# Patient Record
Sex: Male | Born: 1970
Health system: Southern US, Community
[De-identification: ages and names within clinical notes are randomized; demographics above are authoritative.]

## PROBLEM LIST (undated history)

## (undated) ENCOUNTER — Emergency Department (HOSPITAL_BASED_OUTPATIENT_CLINIC_OR_DEPARTMENT_OTHER)
Admission: EM | Disposition: A | Discharge status: Home / Self Care | Payer: BC Managed Care – PPO | Source: Home / Self Care

## (undated) DIAGNOSIS — N189 Chronic kidney disease, unspecified: Secondary | ICD-10-CM

## (undated) DIAGNOSIS — H8110 Benign paroxysmal vertigo, unspecified ear: Secondary | ICD-10-CM

## (undated) DIAGNOSIS — I10 Essential (primary) hypertension: Secondary | ICD-10-CM

## (undated) DIAGNOSIS — K432 Incisional hernia without obstruction or gangrene: Secondary | ICD-10-CM

## (undated) HISTORY — DX: Essential (primary) hypertension: I10

---

## 1996-07-06 HISTORY — PX: CYSTOSCOPY: SUR368

## 1996-07-06 HISTORY — PX: KIDNEY SURGERY: SHX687

## 2000-04-01 ENCOUNTER — Ambulatory Visit (HOSPITAL_COMMUNITY): Admission: RE | Admit: 2000-04-01 | Discharge: 2000-04-01 | Payer: Self-pay | Admitting: Urology

## 2000-04-01 ENCOUNTER — Encounter: Payer: Self-pay | Admitting: Urology

## 2001-05-25 ENCOUNTER — Ambulatory Visit (HOSPITAL_BASED_OUTPATIENT_CLINIC_OR_DEPARTMENT_OTHER): Admission: RE | Admit: 2001-05-25 | Discharge: 2001-05-25 | Payer: Self-pay | Admitting: General Surgery

## 2001-10-05 ENCOUNTER — Emergency Department (HOSPITAL_COMMUNITY): Admission: EM | Admit: 2001-10-05 | Discharge: 2001-10-05 | Payer: Self-pay | Admitting: Emergency Medicine

## 2005-07-30 ENCOUNTER — Ambulatory Visit (HOSPITAL_COMMUNITY): Admission: RE | Admit: 2005-07-30 | Discharge: 2005-07-30 | Payer: Self-pay | Admitting: Urology

## 2016-04-09 DIAGNOSIS — Z23 Encounter for immunization: Secondary | ICD-10-CM | POA: Diagnosis not present

## 2016-11-11 DIAGNOSIS — R109 Unspecified abdominal pain: Secondary | ICD-10-CM | POA: Diagnosis not present

## 2017-02-13 DIAGNOSIS — K644 Residual hemorrhoidal skin tags: Secondary | ICD-10-CM | POA: Diagnosis not present

## 2017-02-13 DIAGNOSIS — A084 Viral intestinal infection, unspecified: Secondary | ICD-10-CM | POA: Diagnosis not present

## 2017-02-15 DIAGNOSIS — A09 Infectious gastroenteritis and colitis, unspecified: Secondary | ICD-10-CM | POA: Diagnosis not present

## 2017-02-16 DIAGNOSIS — A09 Infectious gastroenteritis and colitis, unspecified: Secondary | ICD-10-CM | POA: Diagnosis not present

## 2017-04-08 DIAGNOSIS — Z23 Encounter for immunization: Secondary | ICD-10-CM | POA: Diagnosis not present

## 2017-06-05 HISTORY — PX: APPENDECTOMY: SHX54

## 2017-07-04 ENCOUNTER — Encounter (HOSPITAL_COMMUNITY): Payer: Self-pay | Admitting: Emergency Medicine

## 2017-07-04 ENCOUNTER — Emergency Department (HOSPITAL_COMMUNITY): Payer: BLUE CROSS/BLUE SHIELD | Admitting: Anesthesiology

## 2017-07-04 ENCOUNTER — Encounter (HOSPITAL_COMMUNITY): Admission: EM | Disposition: A | Discharge status: Home / Self Care | Payer: Self-pay | Source: Home / Self Care

## 2017-07-04 ENCOUNTER — Emergency Department (HOSPITAL_COMMUNITY): Payer: BLUE CROSS/BLUE SHIELD

## 2017-07-04 ENCOUNTER — Inpatient Hospital Stay (HOSPITAL_COMMUNITY)
Admission: EM | Admit: 2017-07-04 | Discharge: 2017-07-07 | DRG: 339 | Disposition: A | Discharge status: Home / Self Care | Payer: BLUE CROSS/BLUE SHIELD | Attending: General Surgery | Admitting: General Surgery

## 2017-07-04 ENCOUNTER — Other Ambulatory Visit: Payer: Self-pay

## 2017-07-04 DIAGNOSIS — R109 Unspecified abdominal pain: Secondary | ICD-10-CM | POA: Diagnosis not present

## 2017-07-04 DIAGNOSIS — K3580 Unspecified acute appendicitis: Secondary | ICD-10-CM | POA: Diagnosis present

## 2017-07-04 DIAGNOSIS — N179 Acute kidney failure, unspecified: Secondary | ICD-10-CM | POA: Diagnosis not present

## 2017-07-04 DIAGNOSIS — R7989 Other specified abnormal findings of blood chemistry: Secondary | ICD-10-CM

## 2017-07-04 DIAGNOSIS — K3532 Acute appendicitis with perforation and localized peritonitis, without abscess: Secondary | ICD-10-CM | POA: Diagnosis not present

## 2017-07-04 DIAGNOSIS — R1031 Right lower quadrant pain: Secondary | ICD-10-CM | POA: Diagnosis not present

## 2017-07-04 HISTORY — PX: LAPAROSCOPIC APPENDECTOMY: SHX408

## 2017-07-04 LAB — URINALYSIS, ROUTINE W REFLEX MICROSCOPIC
Bilirubin Urine: NEGATIVE
GLUCOSE, UA: NEGATIVE mg/dL
HGB URINE DIPSTICK: NEGATIVE
KETONES UR: NEGATIVE mg/dL
LEUKOCYTES UA: NEGATIVE
Nitrite: NEGATIVE
PROTEIN: NEGATIVE mg/dL
Specific Gravity, Urine: 1.016 (ref 1.005–1.030)
pH: 5 (ref 5.0–8.0)

## 2017-07-04 LAB — COMPREHENSIVE METABOLIC PANEL
ALK PHOS: 60 U/L (ref 38–126)
ALT: 13 U/L — AB (ref 17–63)
AST: 25 U/L (ref 15–41)
Albumin: 4 g/dL (ref 3.5–5.0)
Anion gap: 9 (ref 5–15)
BILIRUBIN TOTAL: 0.7 mg/dL (ref 0.3–1.2)
BUN: 20 mg/dL (ref 6–20)
CALCIUM: 9.2 mg/dL (ref 8.9–10.3)
CO2: 27 mmol/L (ref 22–32)
CREATININE: 1.34 mg/dL — AB (ref 0.61–1.24)
Chloride: 101 mmol/L (ref 101–111)
Glucose, Bld: 120 mg/dL — ABNORMAL HIGH (ref 65–99)
Potassium: 4 mmol/L (ref 3.5–5.1)
Sodium: 137 mmol/L (ref 135–145)
Total Protein: 7.2 g/dL (ref 6.5–8.1)

## 2017-07-04 LAB — CBC
HCT: 42.3 % (ref 39.0–52.0)
Hemoglobin: 14.2 g/dL (ref 13.0–17.0)
MCH: 30.3 pg (ref 26.0–34.0)
MCHC: 33.6 g/dL (ref 30.0–36.0)
MCV: 90.4 fL (ref 78.0–100.0)
PLATELETS: 243 10*3/uL (ref 150–400)
RBC: 4.68 MIL/uL (ref 4.22–5.81)
RDW: 13.5 % (ref 11.5–15.5)
WBC: 7.8 10*3/uL (ref 4.0–10.5)

## 2017-07-04 LAB — I-STAT CG4 LACTIC ACID, ED: LACTIC ACID, VENOUS: 0.92 mmol/L (ref 0.5–1.9)

## 2017-07-04 LAB — LIPASE, BLOOD: Lipase: 38 U/L (ref 11–51)

## 2017-07-04 SURGERY — APPENDECTOMY, LAPAROSCOPIC
Anesthesia: General | Site: Abdomen

## 2017-07-04 MED ORDER — SODIUM CHLORIDE 0.9 % IV BOLUS (SEPSIS)
1000.0000 mL | Freq: Once | INTRAVENOUS | Status: AC
  Administered 2017-07-04: 1000 mL via INTRAVENOUS

## 2017-07-04 MED ORDER — HYDROMORPHONE HCL 1 MG/ML IJ SOLN
INTRAMUSCULAR | Status: AC
  Administered 2017-07-04: 0.5 mg via INTRAVENOUS
  Filled 2017-07-04: qty 1

## 2017-07-04 MED ORDER — ENOXAPARIN SODIUM 40 MG/0.4ML ~~LOC~~ SOLN
40.0000 mg | SUBCUTANEOUS | Status: DC
  Administered 2017-07-05 – 2017-07-07 (×3): 40 mg via SUBCUTANEOUS
  Filled 2017-07-04 (×3): qty 0.4

## 2017-07-04 MED ORDER — ONDANSETRON HCL 4 MG/2ML IJ SOLN
INTRAMUSCULAR | Status: AC
  Filled 2017-07-04: qty 4

## 2017-07-04 MED ORDER — ROCURONIUM BROMIDE 10 MG/ML (PF) SYRINGE
PREFILLED_SYRINGE | INTRAVENOUS | Status: AC
  Filled 2017-07-04: qty 20

## 2017-07-04 MED ORDER — SUCCINYLCHOLINE 20MG/ML (10ML) SYRINGE FOR MEDFUSION PUMP - OPTIME
INTRAMUSCULAR | Status: DC | PRN
  Administered 2017-07-04: 100 mg via INTRAVENOUS

## 2017-07-04 MED ORDER — PIPERACILLIN-TAZOBACTAM 3.375 G IVPB 30 MIN
3.3750 g | Freq: Once | INTRAVENOUS | Status: AC
  Administered 2017-07-04: 3.375 g via INTRAVENOUS
  Filled 2017-07-04: qty 50

## 2017-07-04 MED ORDER — SODIUM CHLORIDE 0.9 % IV BOLUS (SEPSIS)
500.0000 mL | Freq: Once | INTRAVENOUS | Status: AC
  Administered 2017-07-04: 500 mL via INTRAVENOUS

## 2017-07-04 MED ORDER — PROPOFOL 10 MG/ML IV BOLUS
INTRAVENOUS | Status: DC | PRN
  Administered 2017-07-04: 150 mg via INTRAVENOUS

## 2017-07-04 MED ORDER — SODIUM CHLORIDE 0.9 % IR SOLN
Status: DC | PRN
  Administered 2017-07-04: 1000 mL

## 2017-07-04 MED ORDER — HYDROMORPHONE HCL 1 MG/ML IJ SOLN
0.2500 mg | INTRAMUSCULAR | Status: DC | PRN
  Administered 2017-07-04 (×4): 0.5 mg via INTRAVENOUS

## 2017-07-04 MED ORDER — ONDANSETRON 4 MG PO TBDP: 4.0000 mg | ORAL_TABLET | Freq: Four times a day (QID) | ORAL | Status: DC | PRN

## 2017-07-04 MED ORDER — PROMETHAZINE HCL 25 MG/ML IJ SOLN: 6.2500 mg | INTRAMUSCULAR | Status: DC | PRN

## 2017-07-04 MED ORDER — BUPIVACAINE-EPINEPHRINE 0.25% -1:200000 IJ SOLN
INTRAMUSCULAR | Status: AC
  Filled 2017-07-04: qty 1

## 2017-07-04 MED ORDER — SODIUM CHLORIDE 0.9 % IV SOLN
INTRAVENOUS | Status: DC
  Administered 2017-07-04: 75 mL/h via INTRAVENOUS

## 2017-07-04 MED ORDER — SODIUM CHLORIDE 0.9 % IV SOLN
INTRAVENOUS | Status: DC
  Administered 2017-07-05: 01:00:00 via INTRAVENOUS

## 2017-07-04 MED ORDER — MORPHINE SULFATE (PF) 4 MG/ML IV SOLN
2.0000 mg | INTRAVENOUS | Status: DC | PRN
  Administered 2017-07-04 (×2): 4 mg via INTRAVENOUS
  Filled 2017-07-04 (×2): qty 1

## 2017-07-04 MED ORDER — MIDAZOLAM HCL 2 MG/2ML IJ SOLN
INTRAMUSCULAR | Status: AC
  Filled 2017-07-04: qty 2

## 2017-07-04 MED ORDER — ACETAMINOPHEN 500 MG PO TABS
1000.0000 mg | ORAL_TABLET | Freq: Four times a day (QID) | ORAL | Status: DC
  Administered 2017-07-05 (×2): 1000 mg via ORAL
  Filled 2017-07-04 (×3): qty 2

## 2017-07-04 MED ORDER — SUGAMMADEX SODIUM 200 MG/2ML IV SOLN
INTRAVENOUS | Status: AC
  Filled 2017-07-04: qty 2

## 2017-07-04 MED ORDER — PIPERACILLIN-TAZOBACTAM 3.375 G IVPB
3.3750 g | Freq: Once | INTRAVENOUS | Status: AC
  Administered 2017-07-04: 3.375 g via INTRAVENOUS
  Filled 2017-07-04: qty 50

## 2017-07-04 MED ORDER — 0.9 % SODIUM CHLORIDE (POUR BTL) OPTIME
TOPICAL | Status: DC | PRN
  Administered 2017-07-04: 1000 mL

## 2017-07-04 MED ORDER — FENTANYL CITRATE (PF) 250 MCG/5ML IJ SOLN
INTRAMUSCULAR | Status: DC | PRN
  Administered 2017-07-04 (×3): 50 ug via INTRAVENOUS

## 2017-07-04 MED ORDER — SENNOSIDES-DOCUSATE SODIUM 8.6-50 MG PO TABS: 1.0000 | ORAL_TABLET | Freq: Every evening | ORAL | Status: DC | PRN

## 2017-07-04 MED ORDER — SUCCINYLCHOLINE CHLORIDE 200 MG/10ML IV SOSY
PREFILLED_SYRINGE | INTRAVENOUS | Status: AC
  Filled 2017-07-04: qty 10

## 2017-07-04 MED ORDER — PIPERACILLIN-TAZOBACTAM 3.375 G IVPB
3.3750 g | Freq: Three times a day (TID) | INTRAVENOUS | Status: DC
  Administered 2017-07-05 – 2017-07-07 (×8): 3.375 g via INTRAVENOUS
  Filled 2017-07-04 (×10): qty 50

## 2017-07-04 MED ORDER — DIPHENHYDRAMINE HCL 25 MG PO CAPS: 25.0000 mg | ORAL_CAPSULE | Freq: Four times a day (QID) | ORAL | Status: DC | PRN

## 2017-07-04 MED ORDER — LACTATED RINGERS IV SOLN
INTRAVENOUS | Status: DC | PRN
  Administered 2017-07-04 (×2): via INTRAVENOUS

## 2017-07-04 MED ORDER — ONDANSETRON HCL 4 MG/2ML IJ SOLN
4.0000 mg | Freq: Four times a day (QID) | INTRAMUSCULAR | Status: DC | PRN
  Administered 2017-07-05: 4 mg via INTRAVENOUS
  Filled 2017-07-04: qty 2

## 2017-07-04 MED ORDER — EPHEDRINE SULFATE 50 MG/ML IJ SOLN
INTRAMUSCULAR | Status: DC | PRN
  Administered 2017-07-04: 10 mg via INTRAVENOUS

## 2017-07-04 MED ORDER — DEXAMETHASONE SODIUM PHOSPHATE 10 MG/ML IJ SOLN
INTRAMUSCULAR | Status: AC
  Filled 2017-07-04: qty 2

## 2017-07-04 MED ORDER — KETOROLAC TROMETHAMINE 30 MG/ML IJ SOLN
INTRAMUSCULAR | Status: AC
  Filled 2017-07-04: qty 1

## 2017-07-04 MED ORDER — DIPHENHYDRAMINE HCL 50 MG/ML IJ SOLN: 25.0000 mg | Freq: Four times a day (QID) | INTRAMUSCULAR | Status: DC | PRN

## 2017-07-04 MED ORDER — PROPOFOL 10 MG/ML IV BOLUS
INTRAVENOUS | Status: AC
  Filled 2017-07-04: qty 20

## 2017-07-04 MED ORDER — METRONIDAZOLE IN NACL 5-0.79 MG/ML-% IV SOLN: 500.0000 mg | Freq: Once | INTRAVENOUS | Status: DC

## 2017-07-04 MED ORDER — ROCURONIUM 10MG/ML (10ML) SYRINGE FOR MEDFUSION PUMP - OPTIME
INTRAVENOUS | Status: DC | PRN
  Administered 2017-07-04: 10 mg via INTRAVENOUS
  Administered 2017-07-04: 40 mg via INTRAVENOUS
  Administered 2017-07-04 (×2): 20 mg via INTRAVENOUS

## 2017-07-04 MED ORDER — SUCCINYLCHOLINE CHLORIDE 200 MG/10ML IV SOSY
PREFILLED_SYRINGE | INTRAVENOUS | Status: AC
Start: 2017-07-04 — End: 2017-07-04
  Filled 2017-07-04: qty 10

## 2017-07-04 MED ORDER — HYDROCODONE-ACETAMINOPHEN 5-325 MG PO TABS
1.0000 | ORAL_TABLET | ORAL | Status: DC | PRN
  Administered 2017-07-05: 2 via ORAL
  Administered 2017-07-05: 1 via ORAL
  Administered 2017-07-05: 2 via ORAL
  Administered 2017-07-05: 1 via ORAL
  Administered 2017-07-06 – 2017-07-07 (×7): 2 via ORAL
  Filled 2017-07-04 (×9): qty 2
  Filled 2017-07-04: qty 1
  Filled 2017-07-04: qty 2

## 2017-07-04 MED ORDER — IOPAMIDOL (ISOVUE-300) INJECTION 61%
INTRAVENOUS | Status: AC
  Administered 2017-07-04: 100 mL
  Filled 2017-07-04: qty 100

## 2017-07-04 MED ORDER — MORPHINE SULFATE (PF) 2 MG/ML IV SOLN
2.0000 mg | INTRAVENOUS | Status: DC | PRN
  Administered 2017-07-05: 2 mg via INTRAVENOUS
  Filled 2017-07-04: qty 1

## 2017-07-04 MED ORDER — ONDANSETRON HCL 4 MG/2ML IJ SOLN
INTRAMUSCULAR | Status: DC | PRN
  Administered 2017-07-04: 4 mg via INTRAVENOUS

## 2017-07-04 MED ORDER — HYDROMORPHONE HCL 1 MG/ML IJ SOLN
0.5000 mg | Freq: Once | INTRAMUSCULAR | Status: AC
  Administered 2017-07-04: 0.5 mg via INTRAVENOUS
  Filled 2017-07-04: qty 1

## 2017-07-04 MED ORDER — MIDAZOLAM HCL 2 MG/2ML IJ SOLN
INTRAMUSCULAR | Status: DC | PRN
  Administered 2017-07-04: 2 mg via INTRAVENOUS

## 2017-07-04 MED ORDER — SUGAMMADEX SODIUM 200 MG/2ML IV SOLN
INTRAVENOUS | Status: AC
  Filled 2017-07-04: qty 4

## 2017-07-04 MED ORDER — KETOROLAC TROMETHAMINE 30 MG/ML IJ SOLN
30.0000 mg | Freq: Once | INTRAMUSCULAR | Status: DC | PRN
  Administered 2017-07-04: 30 mg via INTRAVENOUS

## 2017-07-04 MED ORDER — FENTANYL CITRATE (PF) 250 MCG/5ML IJ SOLN
INTRAMUSCULAR | Status: AC
  Filled 2017-07-04: qty 5

## 2017-07-04 MED ORDER — SODIUM CHLORIDE 0.9 % IJ SOLN
INTRAMUSCULAR | Status: AC
  Filled 2017-07-04: qty 20

## 2017-07-04 MED ORDER — EPHEDRINE SULFATE 50 MG/ML IJ SOLN
INTRAMUSCULAR | Status: AC
  Filled 2017-07-04: qty 2

## 2017-07-04 MED ORDER — DEXTROSE 5 % IV SOLN: 2.0000 g | Freq: Once | INTRAVENOUS | Status: DC

## 2017-07-04 MED ORDER — HYDRALAZINE HCL 20 MG/ML IJ SOLN: 10.0000 mg | INTRAMUSCULAR | Status: DC | PRN

## 2017-07-04 MED ORDER — LIDOCAINE 2% (20 MG/ML) 5 ML SYRINGE
INTRAMUSCULAR | Status: AC
  Filled 2017-07-04: qty 20

## 2017-07-04 MED ORDER — DEXAMETHASONE SODIUM PHOSPHATE 10 MG/ML IJ SOLN
INTRAMUSCULAR | Status: DC | PRN
  Administered 2017-07-04: 10 mg via INTRAVENOUS

## 2017-07-04 MED ORDER — BUPIVACAINE-EPINEPHRINE (PF) 0.5% -1:200000 IJ SOLN
INTRAMUSCULAR | Status: AC
  Filled 2017-07-04: qty 30

## 2017-07-04 MED ORDER — LIDOCAINE HCL (CARDIAC) 20 MG/ML IV SOLN
INTRAVENOUS | Status: DC | PRN
  Administered 2017-07-04: 80 mg via INTRATRACHEAL

## 2017-07-04 MED ORDER — SUGAMMADEX SODIUM 200 MG/2ML IV SOLN
INTRAVENOUS | Status: DC | PRN
  Administered 2017-07-04: 200 mg via INTRAVENOUS

## 2017-07-04 MED ORDER — LIDOCAINE 2% (20 MG/ML) 5 ML SYRINGE
INTRAMUSCULAR | Status: AC
  Filled 2017-07-04: qty 5

## 2017-07-04 SURGICAL SUPPLY — 46 items
ADH SKN CLS APL DERMABOND .7 (GAUZE/BANDAGES/DRESSINGS) ×1
APPLIER CLIP 5 13 M/L LIGAMAX5 (MISCELLANEOUS)
APR CLP MED LRG 5 ANG JAW (MISCELLANEOUS)
BAG SPEC RTRVL 10 TROC 200 (ENDOMECHANICALS) ×1
CANISTER SUCT 3000ML PPV (MISCELLANEOUS) ×2 IMPLANT
CHLORAPREP W/TINT 26ML (MISCELLANEOUS) ×2 IMPLANT
CLIP APPLIE 5 13 M/L LIGAMAX5 (MISCELLANEOUS) IMPLANT
CLIP VESOLOCK XL 6/CT (CLIP) ×2 IMPLANT
COVER SURGICAL LIGHT HANDLE (MISCELLANEOUS) ×2 IMPLANT
CUTTER ENDO LINEAR 45M (STAPLE) ×1 IMPLANT
CUTTER FLEX LINEAR 45M (STAPLE) ×1 IMPLANT
DERMABOND ADVANCED (GAUZE/BANDAGES/DRESSINGS) ×1
DERMABOND ADVANCED .7 DNX12 (GAUZE/BANDAGES/DRESSINGS) ×1 IMPLANT
DEVICE PMI PUNCTURE CLOSURE (MISCELLANEOUS) ×1 IMPLANT
ELECT REM PT RETURN 9FT ADLT (ELECTROSURGICAL) ×2
ELECTRODE REM PT RTRN 9FT ADLT (ELECTROSURGICAL) ×1 IMPLANT
ENDOLOOP SUT PDS II  0 18 (SUTURE)
ENDOLOOP SUT PDS II 0 18 (SUTURE) IMPLANT
GLOVE BIOGEL PI IND STRL 7.0 (GLOVE) ×1 IMPLANT
GLOVE BIOGEL PI IND STRL 7.5 (GLOVE) IMPLANT
GLOVE BIOGEL PI INDICATOR 7.0 (GLOVE) ×2
GLOVE BIOGEL PI INDICATOR 7.5 (GLOVE) ×1
GLOVE ECLIPSE 7.0 STRL STRAW (GLOVE) ×1 IMPLANT
GLOVE SURG SS PI 7.0 STRL IVOR (GLOVE) ×2 IMPLANT
GOWN STRL REUS W/ TWL LRG LVL3 (GOWN DISPOSABLE) ×3 IMPLANT
GOWN STRL REUS W/TWL LRG LVL3 (GOWN DISPOSABLE) ×6
KIT BASIN OR (CUSTOM PROCEDURE TRAY) ×2 IMPLANT
KIT ROOM TURNOVER OR (KITS) ×2 IMPLANT
NS IRRIG 1000ML POUR BTL (IV SOLUTION) ×2 IMPLANT
PAD ARMBOARD 7.5X6 YLW CONV (MISCELLANEOUS) ×4 IMPLANT
POUCH RETRIEVAL ECOSAC 10 (ENDOMECHANICALS) ×1 IMPLANT
POUCH RETRIEVAL ECOSAC 10MM (ENDOMECHANICALS) ×1
RELOAD STAPLE 45 3.5 BLU ETS (ENDOMECHANICALS) IMPLANT
RELOAD STAPLE TA45 3.5 REG BLU (ENDOMECHANICALS) ×4 IMPLANT
SCISSORS LAP 5X35 DISP (ENDOMECHANICALS) ×2 IMPLANT
SET IRRIG TUBING LAPAROSCOPIC (IRRIGATION / IRRIGATOR) ×2 IMPLANT
SPECIMEN JAR SMALL (MISCELLANEOUS) ×2 IMPLANT
SUT MNCRL AB 4-0 PS2 18 (SUTURE) ×2 IMPLANT
TOWEL OR 17X24 6PK STRL BLUE (TOWEL DISPOSABLE) ×2 IMPLANT
TOWEL OR 17X26 10 PK STRL BLUE (TOWEL DISPOSABLE) ×2 IMPLANT
TRAY FOLEY CATH SILVER 16FR (SET/KITS/TRAYS/PACK) IMPLANT
TRAY LAPAROSCOPIC MC (CUSTOM PROCEDURE TRAY) ×2 IMPLANT
TROCAR BLADELESS 12MM (ENDOMECHANICALS) ×1 IMPLANT
TROCAR XCEL NON-BLD 11X100MML (ENDOMECHANICALS) ×2 IMPLANT
TROCAR XCEL NON-BLD 5MMX100MML (ENDOMECHANICALS) ×4 IMPLANT
TUBING INSUFFLATION (TUBING) ×2 IMPLANT

## 2017-07-04 NOTE — Anesthesia Postprocedure Evaluation (Signed)
Anesthesia Post Note  Patient: Troy Luna  Procedure(s) Performed: APPENDECTOMY LAPAROSCOPIC (N/A Abdomen)     Patient location during evaluation: PACU Anesthesia Type: General Level of consciousness: awake and alert Pain management: pain level controlled Vital Signs Assessment: post-procedure vital signs reviewed and stable Respiratory status: spontaneous breathing, nonlabored ventilation, respiratory function stable and patient connected to nasal cannula oxygen Cardiovascular status: blood pressure returned to baseline and stable Postop Assessment: no apparent nausea or vomiting Anesthetic complications: no    Last Vitals:  Vitals:   07/04/17 1645 07/04/17 2212  BP: 130/90 (!) 150/98  Pulse: (!) 47 74  Resp: 14 17  Temp:  (!) 36.3 C  SpO2: 97% 100%    Last Pain:  Vitals:   07/04/17 2230  TempSrc:   PainSc: 7                  Talon Witting S

## 2017-07-04 NOTE — H&P (Signed)
Troy Luna is an 46 y.o. male.   Chief Complaint: abdominal pain HPI: 46 yo male with 1 day of abdominal pain. Pain has been in right lower abdomen. He has never had pain like this before. Pain does not radiate. Pain is constant and not associated with anything. He denies nausea or vomiting. He denies diarrhea or constipation.  History reviewed. No pertinent past medical history.  Past Surgical History:  Procedure Laterality Date  . KIDNEY SURGERY Left     History reviewed. No pertinent family history. Social History:  reports that  has never smoked. he has never used smokeless tobacco. He reports that he does not drink alcohol or use drugs.  Allergies: No Known Allergies   (Not in a hospital admission)  Results for orders placed or performed during the hospital encounter of 07/04/17 (from the past 48 hour(s))  Lipase, blood     Status: None   Collection Time: 07/04/17  5:37 AM  Result Value Ref Range   Lipase 38 11 - 51 U/L  Comprehensive metabolic panel     Status: Abnormal   Collection Time: 07/04/17  5:37 AM  Result Value Ref Range   Sodium 137 135 - 145 mmol/L   Potassium 4.0 3.5 - 5.1 mmol/L   Chloride 101 101 - 111 mmol/L   CO2 27 22 - 32 mmol/L   Glucose, Bld 120 (H) 65 - 99 mg/dL   BUN 20 6 - 20 mg/dL   Creatinine, Ser 1.34 (H) 0.61 - 1.24 mg/dL   Calcium 9.2 8.9 - 10.3 mg/dL   Total Protein 7.2 6.5 - 8.1 g/dL   Albumin 4.0 3.5 - 5.0 g/dL   AST 25 15 - 41 U/L   ALT 13 (L) 17 - 63 U/L   Alkaline Phosphatase 60 38 - 126 U/L   Total Bilirubin 0.7 0.3 - 1.2 mg/dL   GFR calc non Af Amer >60 >60 mL/min   GFR calc Af Amer >60 >60 mL/min    Comment: (NOTE) The eGFR has been calculated using the CKD EPI equation. This calculation has not been validated in all clinical situations. eGFR's persistently <60 mL/min signify possible Chronic Kidney Disease.    Anion gap 9 5 - 15  CBC     Status: None   Collection Time: 07/04/17  5:37 AM  Result Value Ref Range   WBC  7.8 4.0 - 10.5 K/uL   RBC 4.68 4.22 - 5.81 MIL/uL   Hemoglobin 14.2 13.0 - 17.0 g/dL   HCT 42.3 39.0 - 52.0 %   MCV 90.4 78.0 - 100.0 fL   MCH 30.3 26.0 - 34.0 pg   MCHC 33.6 30.0 - 36.0 g/dL   RDW 13.5 11.5 - 15.5 %   Platelets 243 150 - 400 K/uL  Urinalysis, Routine w reflex microscopic     Status: None   Collection Time: 07/04/17  5:37 AM  Result Value Ref Range   Color, Urine YELLOW YELLOW   APPearance CLEAR CLEAR   Specific Gravity, Urine 1.016 1.005 - 1.030   pH 5.0 5.0 - 8.0   Glucose, UA NEGATIVE NEGATIVE mg/dL   Hgb urine dipstick NEGATIVE NEGATIVE   Bilirubin Urine NEGATIVE NEGATIVE   Ketones, ur NEGATIVE NEGATIVE mg/dL   Protein, ur NEGATIVE NEGATIVE mg/dL   Nitrite NEGATIVE NEGATIVE   Leukocytes, UA NEGATIVE NEGATIVE  I-Stat CG4 Lactic Acid, ED     Status: None   Collection Time: 07/04/17  5:52 AM  Result Value Ref Range  Lactic Acid, Venous 0.92 0.5 - 1.9 mmol/L   Ct Abdomen Pelvis W Contrast  Result Date: 07/04/2017 CLINICAL DATA:  Right lower quadrant abdominal pain onset 8 hours earlier. EXAM: CT ABDOMEN AND PELVIS WITH CONTRAST TECHNIQUE: Multidetector CT imaging of the abdomen and pelvis was performed using the standard protocol following bolus administration of intravenous contrast. CONTRAST:  11m ISOVUE-300 IOPAMIDOL (ISOVUE-300) INJECTION 61% COMPARISON:  None. FINDINGS: Lower chest: No significant pulmonary nodules or acute consolidative airspace disease. Hepatobiliary: Normal liver size. No liver mass. Normal gallbladder with no radiopaque cholelithiasis. No biliary ductal dilatation. Pancreas: Normal, with no mass or duct dilation. Spleen: Normal size. No mass. Adrenals/Urinary Tract: Normal adrenals. Asymmetric moderate left renal atrophy. Mild-to-moderate left hydronephrosis with prominently dilated left extrarenal pelvis with abrupt caliber transition at the left ureteropelvic junction, most compatible with a congenital UPJ stenosis. No renal masses. No  right hydronephrosis. Normal bladder. Stomach/Bowel: Normal non-distended stomach. Normal caliber small bowel with no small bowel wall thickening. Markedly dilated appendix (29 mm diameter). Marked diffuse appendiceal wall thickening and hyperenhancement with prominent periappendiceal fat stranding. Findings are compatible with acute appendicitis. There are multiple calcified appendicoliths, largest 16 mm in the proximal appendix. Appendix: Location: Right lower quadrant Diameter: 29 mm Appendicolith: Present Mucosal hyper-enhancement: Present Extraluminal gas: Not present Periappendiceal collection: Not present Normal large bowel with no diverticulosis, large bowel wall thickening or pericolonic fat stranding. Vascular/Lymphatic: Normal caliber abdominal aorta. Patent portal, splenic, hepatic and renal veins. No pathologically enlarged lymph nodes in the abdomen or pelvis. Reproductive: Normal size prostate with nonspecific internal prostatic calcifications. Other: No pneumoperitoneum, ascites or focal fluid collection. Musculoskeletal: No aggressive appearing focal osseous lesions. IMPRESSION: 1. Acute appendicitis with appendicoliths.  No abscess or free air. 2. Asymmetric left renal atrophy with findings most compatible with congenital left UPJ stenosis. Critical Value/emergent results were called by telephone at the time of interpretation on 07/04/2017 at 11:11 am [facility], who verbally acknowledged these results. Electronically Signed   By: JIlona SorrelM.D.   On: 07/04/2017 11:12    Review of Systems  Constitutional: Negative for chills and fever.  HENT: Negative for hearing loss.   Eyes: Negative for blurred vision and double vision.  Respiratory: Negative for cough and hemoptysis.   Cardiovascular: Negative for chest pain and palpitations.  Gastrointestinal: Positive for abdominal pain. Negative for nausea and vomiting.  Genitourinary: Negative for dysuria and urgency.  Musculoskeletal:  Negative for myalgias and neck pain.  Skin: Negative for itching and rash.  Neurological: Negative for dizziness, tingling and headaches.  Endo/Heme/Allergies: Does not bruise/bleed easily.  Psychiatric/Behavioral: Negative for depression and suicidal ideas.    Blood pressure (!) 163/96, pulse 70, temperature 97.8 F (36.6 C), temperature source Oral, resp. rate 12, height _0  (1.753 m), weight 85.3 kg (188 lb), SpO2 99 %. Physical Exam  Vitals reviewed. Constitutional: He is oriented to person, place, and time. He appears well-developed and well-nourished.  HENT:  Head: Normocephalic and atraumatic.  Eyes: Conjunctivae and EOM are normal. Pupils are equal, round, and reactive to light.  Neck: Normal range of motion. Neck supple.  Cardiovascular: Normal rate and regular rhythm.  Respiratory: Effort normal and breath sounds normal.  GI: Soft. Bowel sounds are normal. He exhibits no distension. There is tenderness in the right lower quadrant. There is no rigidity and no guarding.  Musculoskeletal: Normal range of motion.  Neurological: He is alert and oriented to person, place, and time.  Skin: Skin is warm and  dry.  Psychiatric: He has a normal mood and affect. His behavior is normal.     Assessment/Plan 46 yo male with abdominal pain and CT findings consistent with acute appendicitis -abx -lap appendectomy -we discussed the details of the procedure; that it would be done under general anesthesia, that we would attempt to do the procedure laparoscopically. That the appendix would be isolated from the large and small intestine and then ligated and removed. We discussed the reason for this is to avoid rupture and infection and resolve the pains. We discussed risks of infection, abscess, injury to intestines or urinary structures, and need for open incision. He showed good understanding and wanted to proceed. -planned observation  Mickeal Skinner, MD 07/04/2017, 12:04 PM

## 2017-07-04 NOTE — Anesthesia Preprocedure Evaluation (Addendum)
Anesthesia Evaluation  Patient identified by MRN, date of birth, ID band Patient awake    Reviewed: Allergy & Precautions, NPO status , Patient's Chart, lab work & pertinent test results  Airway Mallampati: II  TM Distance: >3 FB Neck ROM: Full    Dental no notable dental hx. (+) Teeth Intact, Dental Advisory Given   Pulmonary neg pulmonary ROS,    Pulmonary exam normal breath sounds clear to auscultation       Cardiovascular negative cardio ROS Normal cardiovascular exam Rhythm:Regular Rate:Normal     Neuro/Psych negative neurological ROS  negative psych ROS   GI/Hepatic negative GI ROS, Neg liver ROS,   Endo/Other  negative endocrine ROS  Renal/GU negative Renal ROS  negative genitourinary   Musculoskeletal negative musculoskeletal ROS (+)   Abdominal   Peds negative pediatric ROS (+)  Hematology negative hematology ROS (+)   Anesthesia Other Findings   Reproductive/Obstetrics negative OB ROS                            Anesthesia Physical Anesthesia Plan  ASA: I  Anesthesia Plan: General   Post-op Pain Management:    Induction: Intravenous  PONV Risk Score and Plan: 2 and Ondansetron, Dexamethasone and Treatment may vary due to age or medical condition  Airway Management Planned: Oral ETT  Additional Equipment:   Intra-op Plan:   Post-operative Plan: Extubation in OR  Informed Consent: I have reviewed the patients History and Physical, chart, labs and discussed the procedure including the risks, benefits and alternatives for the proposed anesthesia with the patient or authorized representative who has indicated his/her understanding and acceptance.   Dental advisory given  Plan Discussed with: CRNA and Surgeon  Anesthesia Plan Comments:         Anesthesia Quick Evaluation

## 2017-07-04 NOTE — Transfer of Care (Signed)
Immediate Anesthesia Transfer of Care Note  Patient: Troy Luna  Procedure(s) Performed: APPENDECTOMY LAPAROSCOPIC (N/A Abdomen)  Patient Location: PACU  Anesthesia Type:General  Level of Consciousness: awake, sedated and patient cooperative  Airway & Oxygen Therapy: Patient connected to nasal cannula oxygen  Post-op Assessment: Report given to RN and Post -op Vital signs reviewed and stable  Post vital signs: Reviewed and stable  Last Vitals:  Vitals:   07/04/17 1645 07/04/17 2212  BP: 130/90 (!) (P) 150/98  Pulse: (!) 47 (P) 74  Resp: 14 (P) 17  Temp:  (!) (P) 36.3 C  SpO2: 97% (P) 100%    Last Pain:  Vitals:   07/04/17 2212  TempSrc:   PainSc: (P) 0-No pain         Complications: No apparent anesthesia complications

## 2017-07-04 NOTE — Anesthesia Procedure Notes (Signed)
Procedure Name: Intubation Date/Time: 07/04/2017 7:48 PM Performed by: Valetta Fuller, CRNA Pre-anesthesia Checklist: Patient identified, Emergency Drugs available, Suction available and Patient being monitored Patient Re-evaluated:Patient Re-evaluated prior to induction Oxygen Delivery Method: Circle system utilized Preoxygenation: Pre-oxygenation with 100% oxygen Induction Type: IV induction, Rapid sequence and Cricoid Pressure applied Laryngoscope Size: Miller and 2 Grade View: Grade I Tube type: Oral Tube size: 7.5 mm Number of attempts: 1 Airway Equipment and Method: Stylet Placement Confirmation: ETT inserted through vocal cords under direct vision,  positive ETCO2 and breath sounds checked- equal and bilateral Secured at: 23 cm Tube secured with: Tape Dental Injury: Teeth and Oropharynx as per pre-operative assessment

## 2017-07-04 NOTE — ED Triage Notes (Signed)
Patient arrives with complaint of RLQ abd pain. Onset 8 hrs ago. History of Kidney surgery on left side 20 years ago, denies other pertinent history. Endorses nausea.

## 2017-07-04 NOTE — Op Note (Signed)
Preoperative diagnosis: acute appendicitis with perforation  Postoperative diagnosis: Same   Procedure: laparoscopic appendectomy  Surgeon: Gurney Maxin, M.D.  Asst: none  Anesthesia: Gen.   Indications for procedure: Troy Luna is a 46 y.o. male with symptoms of pain in right lower quadrant and nausea consistent with acute appendicitis. Confirmed by CT scan.  Description of procedure: The patient was brought into the operative suite, placed supine. Anesthesia was administered with endotracheal tube. The patient's left arm was tucked. All pressure points were offloaded by foam padding. The patient was prepped and draped in the usual sterile fashion.  A transverse incision was made to the left of the umbilicus and a 49QP trocar was [place]. Pneumoperitoneum was applied with high flow low pressure.  2 85mm trocars were placed, one in the suprapubic space, one in the LLQ. Pneumoperitoneum was applied with high flow low pressure.  2 15mm trocars were placed, one in the suprapubic space, one in the LLQ. All trocars sites were first anesthesized with marcaine with epinephrine. Next the patient was placed in trendelenberg, rotated to the left. The omentum was retracted cephalad. The cecum and appendix were identified. There was a large mass of cecum and appendix and distal ileum adhered to each other. There were some peritoneal attachments of the ileum to the abdominal wall that was taken down with scissors. Blunt dissection was used to free the ileum from the appendix and cecum. This allowed visualization of the appendix which moved laterally. Further dissection allowed the tip to be free from the cecum. The mesoappendix was bluntly dissected. Multiple small amounts of mesoappendix were dissected and clipped and divided. Finally the base of the appendix was identified. 2 44mm blue load staplers were used to divide the appendix at the base.  The appendix was placed in a specimen bag. The pelvis and RLQ  were irrigated. The appendix was removed via the umbilicus. The fascia and skin had to widened to allow the appendix to pass. 0 vicryl was used to close the fascial defect in interrupted fashion. Pneumoperitoneum was removed, all trocars were removed. All incisions were closed with 4-0 monocryl subcuticular stitch. The patient woke from anesthesia and was brought to PACU in stable condition.  Findings: ruptured appendicitis  Specimen: appendix  Blood loss: 50 ml  Local anesthesia: 30 ml marcaine  Complications: none  Gurney Maxin, M.D. General, Bariatric, & Minimally Invasive Surgery [facility]

## 2017-07-04 NOTE — ED Provider Notes (Signed)
[place] EMERGENCY DEPARTMENT Provider Note   CSN: [id number] Arrival date & time: 07/04/17  0507     History   Chief Complaint Chief Complaint  Patient presents with  . Abdominal Pain    HPI Troy Luna is a 46 y.o. male who presents with abdominal pain. PMH significant for congenital kidney disease. He states that he first noticed the pain last night. He went to sleep and when he woke up around 4:30 AM he had a "debilitating pain". The pain is sharp, constant, and in the RLQ. It is worse with movement. He denies fever, chills, chest pain, SOB, N/V/D, constipation, melena/hematochezia, urinary symptoms. He has had a prior kidney surgery to fix a "congenital blockage". This was done in [place] in 1997. He is not on any medicines. He has been NPO since 7PM last night.  HPI  History reviewed. No pertinent past medical history.  There are no active problems to display for this patient.   Past Surgical History:  Procedure Laterality Date  . KIDNEY SURGERY Left        Home Medications    Prior to Admission medications   Medication Sig Start Date End Date Taking? Authorizing Provider  ibuprofen (ADVIL,MOTRIN) 200 MG tablet Take 400 mg by mouth every 6 (six) hours as needed for moderate pain.   Yes [provider]    Family History History reviewed. No pertinent family history.  Social History Social History   Tobacco Use  . Smoking status: Never Smoker  . Smokeless tobacco: Never Used  Substance Use Topics  . Alcohol use: No    Frequency: Never  . Drug use: No     Allergies   Patient has no known allergies.   Review of Systems Review of Systems  Constitutional: Negative for chills and fever.  Respiratory: Negative for shortness of breath.   Cardiovascular: Negative for chest pain.  Gastrointestinal: Positive for abdominal pain. Negative for blood in stool, constipation, diarrhea, nausea and vomiting.  Genitourinary:  Negative for difficulty urinating, dysuria and hematuria.  All other systems reviewed and are negative.    Physical Exam Updated Vital Signs BP (!) 163/96   Pulse 70   Temp 97.8 F (36.6 C) (Oral)   Resp 12   Ht 5\' 9"  (1.753 m)   Wt 85.3 kg (188 lb)   SpO2 99%   BMI 27.76 kg/m   Physical Exam  Constitutional: He is oriented to person, place, and time. He appears well-developed and well-nourished. No distress.  Calm, cooperative  HENT:  Head: Normocephalic and atraumatic.  Eyes: Conjunctivae are normal. Pupils are equal, round, and reactive to light. Right eye exhibits no discharge. Left eye exhibits no discharge. No scleral icterus.  Neck: Normal range of motion.  Cardiovascular: Normal rate and regular rhythm. Exam reveals no gallop and no friction rub.  No murmur heard. Pulmonary/Chest: Effort normal and breath sounds normal. No stridor. No respiratory distress. He has no wheezes. He has no rales. He exhibits no tenderness.  Abdominal: Soft. He exhibits no distension and no mass. There is tenderness (RLQ tenderness). There is rebound and guarding. No hernia.  Prior surgical scar over LUQ  Neurological: He is alert and oriented to person, place, and time.  Skin: Skin is warm and dry.  Psychiatric: He has a normal mood and affect. His behavior is normal.  Nursing note and vitals reviewed.    ED Treatments / Results  Labs (all labs ordered are listed, but only abnormal  results are displayed) Labs Reviewed  COMPREHENSIVE METABOLIC PANEL - Abnormal; Notable for the following components:      Result Value   Glucose, Bld 120 (*)    Creatinine, Ser 1.34 (*)    ALT 13 (*)    All other components within normal limits  LIPASE, BLOOD  CBC  URINALYSIS, ROUTINE W REFLEX MICROSCOPIC  I-STAT CG4 LACTIC ACID, ED    EKG  EKG Interpretation None       Radiology Ct Abdomen Pelvis W Contrast  Result Date: 07/04/2017 CLINICAL DATA:  Right lower quadrant abdominal pain  onset 8 hours earlier. EXAM: CT ABDOMEN AND PELVIS WITH CONTRAST TECHNIQUE: Multidetector CT imaging of the abdomen and pelvis was performed using the standard protocol following bolus administration of intravenous contrast. CONTRAST:  176mL ISOVUE-300 IOPAMIDOL (ISOVUE-300) INJECTION 61% COMPARISON:  None. FINDINGS: Lower chest: No significant pulmonary nodules or acute consolidative airspace disease. Hepatobiliary: Normal liver size. No liver mass. Normal gallbladder with no radiopaque cholelithiasis. No biliary ductal dilatation. Pancreas: Normal, with no mass or duct dilation. Spleen: Normal size. No mass. Adrenals/Urinary Tract: Normal adrenals. Asymmetric moderate left renal atrophy. Mild-to-moderate left hydronephrosis with prominently dilated left extrarenal pelvis with abrupt caliber transition at the left ureteropelvic junction, most compatible with a congenital UPJ stenosis. No renal masses. No right hydronephrosis. Normal bladder. Stomach/Bowel: Normal non-distended stomach. Normal caliber small bowel with no small bowel wall thickening. Markedly dilated appendix (29 mm diameter). Marked diffuse appendiceal wall thickening and hyperenhancement with prominent periappendiceal fat stranding. Findings are compatible with acute appendicitis. There are multiple calcified appendicoliths, largest 16 mm in the proximal appendix. Appendix: Location: Right lower quadrant Diameter: 29 mm Appendicolith: Present Mucosal hyper-enhancement: Present Extraluminal gas: Not present Periappendiceal collection: Not present Normal large bowel with no diverticulosis, large bowel wall thickening or pericolonic fat stranding. Vascular/Lymphatic: Normal caliber abdominal aorta. Patent portal, splenic, hepatic and renal veins. No pathologically enlarged lymph nodes in the abdomen or pelvis. Reproductive: Normal size prostate with nonspecific internal prostatic calcifications. Other: No pneumoperitoneum, ascites or focal fluid  collection. Musculoskeletal: No aggressive appearing focal osseous lesions. IMPRESSION: 1. Acute appendicitis with appendicoliths.  No abscess or free air. 2. Asymmetric left renal atrophy with findings most compatible with congenital left UPJ stenosis. Critical Value/emergent results were called by telephone at the time of interpretation on 07/04/2017 at 11:11 am to [facility], [facility], who verbally acknowledged these results. Electronically Signed   By: Ilona Sorrel M.D.   On: 07/04/2017 11:12    Procedures Procedures (including critical care time)  Medications Ordered in ED Medications  sodium chloride 0.9 % bolus 500 mL (not administered)  piperacillin-tazobactam (ZOSYN) IVPB 3.375 g (3.375 g Intravenous New Bag/Given 07/04/17 1232)  sodium chloride 0.9 % bolus 1,000 mL (1,000 mLs Intravenous New Bag/Given 07/04/17 1025)  iopamidol (ISOVUE-300) 61 % injection (100 mLs  Contrast Given 07/04/17 1043)  HYDROmorphone (DILAUDID) injection 0.5 mg (0.5 mg Intravenous Given 07/04/17 1229)     Initial Impression / Assessment and Plan / ED Course  I have reviewed the triage vital signs and the nursing notes.  Pertinent labs & imaging results that were available during my care of the patient were reviewed by me and considered in my medical decision making (see chart for details).  46 year old male presents with acute appendicitis. He is hypertensive but otherwise vitals are normal. Labs are remarkable for elevated SCr (1.3) - unclear what his baseline is but likely he has some chronic kidney disease based on his  history. CT confirms appendicitis which is markedly dilated but is not perforated and there is no abscess formation. Fluids, Zosyn, pain medicine were given. Shared visit with Dr. Jeanell Sparrow. Spoke with Theodoro Grist PA-C who will come to see patient.  Final Clinical Impressions(s) / ED Diagnoses   Final diagnoses:  Acute appendicitis, unspecified acute appendicitis type  Elevated serum creatinine     ED Discharge Orders    None       Recardo Evangelist, PA-C 07/04/17 1156    Recardo Evangelist, PA-C 07/04/17 1239    Pattricia Boss, MD 07/05/17 [phone]

## 2017-07-04 NOTE — ED Provider Notes (Signed)
  I performed a history and physical examination of Troy Luna and discussed his management with Ms. Ruthann Cancer.  I agree with the history, physical, assessment, and plan of care, with the following exceptions: None  I was present for the following procedures: None Time Spent in Critical Care of the patient: None Time spent in discussions with the patient and family: 7  Maronda Caison Waylan Rocher, MD 07/04/17 1141

## 2017-07-05 ENCOUNTER — Other Ambulatory Visit: Payer: Self-pay

## 2017-07-05 ENCOUNTER — Encounter (HOSPITAL_COMMUNITY): Payer: Self-pay | Admitting: General Surgery

## 2017-07-05 LAB — CBC
HCT: 38.9 % — ABNORMAL LOW (ref 39.0–52.0)
Hemoglobin: 13 g/dL (ref 13.0–17.0)
MCH: 30.5 pg (ref 26.0–34.0)
MCHC: 33.4 g/dL (ref 30.0–36.0)
MCV: 91.3 fL (ref 78.0–100.0)
PLATELETS: 221 10*3/uL (ref 150–400)
RBC: 4.26 MIL/uL (ref 4.22–5.81)
RDW: 13.9 % (ref 11.5–15.5)
WBC: 13.3 10*3/uL — AB (ref 4.0–10.5)

## 2017-07-05 LAB — CREATININE, SERUM
CREATININE: 1.39 mg/dL — AB (ref 0.61–1.24)
GFR, EST NON AFRICAN AMERICAN: 59 mL/min — AB (ref >60)

## 2017-07-05 MED ORDER — SODIUM CHLORIDE 0.45 % IV SOLN
INTRAVENOUS | Status: DC
  Administered 2017-07-05 – 2017-07-07 (×3): via INTRAVENOUS
  Filled 2017-07-05 (×6): qty 1000

## 2017-07-05 MED ORDER — MORPHINE SULFATE (PF) 2 MG/ML IV SOLN
2.0000 mg | INTRAVENOUS | Status: DC | PRN
  Administered 2017-07-05: 2 mg via INTRAVENOUS
  Filled 2017-07-05: qty 1

## 2017-07-05 NOTE — Progress Notes (Signed)
Pt. Tolerating regular diet well.  Pt. Ambulating in hallway independently. Pt. Showered with assistance from wife.

## 2017-07-05 NOTE — Progress Notes (Signed)
Patient ID: Troy Luna, male   DOB: 1971/05/18, 46 y.o.   MRN: [id number]  [facility] Progress Note  1 Day Post-Op  Subjective: CC-  Patient tired this morning. States that abdominal pain is well controlled. Denies n/v. He is tolerating clear liquids. Passing flatus, no BM. Has not been OOB yet since surgery.  Objective: Vital signs in last 24 hours: Temp:  [97.3 F (36.3 C)-98.7 F (37.1 C)] 98.3 F (36.8 C) (12/31 0538) Pulse Rate:  [47-74] 63 (12/31 0538) Resp:  [12-23] 16 (12/31 0538) BP: (119-173)/(66-113) 124/66 (12/31 0538) SpO2:  [95 %-100 %] 95 % (12/31 0538) Weight:  [188 lb (85.3 kg)] 188 lb (85.3 kg) (12/30 2300)    Intake/Output from previous day: 12/30 0701 - 12/31 0700 In: 1356.3 [I.V.:1306.3; IV Piggyback:50] Out: 1000 [Urine:950; Blood:50] Intake/Output this shift: No intake/output data recorded.  PE: Gen:  Alert, NAD, pleasant HEENT: EOM's intact, pupils equal and round Card:  RRR, no M/G/R heard Pulm:  CTAB, no W/R/R, effort normal Abd: Soft, mild distension, appropriately tender, +BS, lap incisions C/D/I Skin: no rashes noted, warm and dry  Lab Results:  Recent Labs    07/04/17 0537 07/05/17 0044  WBC 7.8 13.3*  HGB 14.2 13.0  HCT 42.3 38.9*  PLT 243 221   BMET Recent Labs    07/04/17 0537 07/05/17 0044  NA 137  --   K 4.0  --   CL 101  --   CO2 27  --   GLUCOSE 120*  --   BUN 20  --   CREATININE 1.34* 1.39*  CALCIUM 9.2  --    PT/INR No results for input(s): LABPROT, INR in the last 72 hours. CMP     Component Value Date/Time   NA 137 07/04/2017 0537   K 4.0 07/04/2017 0537   CL 101 07/04/2017 0537   CO2 27 07/04/2017 0537   GLUCOSE 120 (H) 07/04/2017 0537   BUN 20 07/04/2017 0537   CREATININE 1.39 (H) 07/05/2017 0044   CALCIUM 9.2 07/04/2017 0537   PROT 7.2 07/04/2017 0537   ALBUMIN 4.0 07/04/2017 0537   AST 25 07/04/2017 0537   ALT 13 (L) 07/04/2017 0537   ALKPHOS 60 07/04/2017 0537   BILITOT 0.7  07/04/2017 0537   GFRNONAA 59 (L) 07/05/2017 0044   GFRAA >60 07/05/2017 0044   Lipase     Component Value Date/Time   LIPASE 38 07/04/2017 0537       Studies/Results: Ct Abdomen Pelvis W Contrast  Result Date: 07/04/2017 CLINICAL DATA:  Right lower quadrant abdominal pain onset 8 hours earlier. EXAM: CT ABDOMEN AND PELVIS WITH CONTRAST TECHNIQUE: Multidetector CT imaging of the abdomen and pelvis was performed using the standard protocol following bolus administration of intravenous contrast. CONTRAST:  13mL ISOVUE-300 IOPAMIDOL (ISOVUE-300) INJECTION 61% COMPARISON:  None. FINDINGS: Lower chest: No significant pulmonary nodules or acute consolidative airspace disease. Hepatobiliary: Normal liver size. No liver mass. Normal gallbladder with no radiopaque cholelithiasis. No biliary ductal dilatation. Pancreas: Normal, with no mass or duct dilation. Spleen: Normal size. No mass. Adrenals/Urinary Tract: Normal adrenals. Asymmetric moderate left renal atrophy. Mild-to-moderate left hydronephrosis with prominently dilated left extrarenal pelvis with abrupt caliber transition at the left ureteropelvic junction, most compatible with a congenital UPJ stenosis. No renal masses. No right hydronephrosis. Normal bladder. Stomach/Bowel: Normal non-distended stomach. Normal caliber small bowel with no small bowel wall thickening. Markedly dilated appendix (29 mm diameter). Marked diffuse appendiceal wall thickening and hyperenhancement with prominent periappendiceal fat  stranding. Findings are compatible with acute appendicitis. There are multiple calcified appendicoliths, largest 16 mm in the proximal appendix. Appendix: Location: Right lower quadrant Diameter: 29 mm Appendicolith: Present Mucosal hyper-enhancement: Present Extraluminal gas: Not present Periappendiceal collection: Not present Normal large bowel with no diverticulosis, large bowel wall thickening or pericolonic fat stranding.  Vascular/Lymphatic: Normal caliber abdominal aorta. Patent portal, splenic, hepatic and renal veins. No pathologically enlarged lymph nodes in the abdomen or pelvis. Reproductive: Normal size prostate with nonspecific internal prostatic calcifications. Other: No pneumoperitoneum, ascites or focal fluid collection. Musculoskeletal: No aggressive appearing focal osseous lesions. IMPRESSION: 1. Acute appendicitis with appendicoliths.  No abscess or free air. 2. Asymmetric left renal atrophy with findings most compatible with congenital left UPJ stenosis. Critical Value/emergent results were called by telephone at the time of interpretation on 07/04/2017 at 11:11 am to [facility], [place], who verbally acknowledged these results. Electronically Signed   By: Ilona Sorrel M.D.   On: 07/04/2017 11:12    Anti-infectives: Anti-infectives (From admission, onward)   Start     Dose/Rate Route Frequency Ordered Stop   07/05/17 0000  piperacillin-tazobactam (ZOSYN) IVPB 3.375 g     3.375 g 12.5 mL/hr over 240 Minutes Intravenous Every 8 hours 07/04/17 2344     07/04/17 2030  piperacillin-tazobactam (ZOSYN) IVPB 3.375 g     3.375 g 12.5 mL/hr over 240 Minutes Intravenous  Once 07/04/17 2015 07/04/17 2021   07/04/17 1145  piperacillin-tazobactam (ZOSYN) IVPB 3.375 g     3.375 g 100 mL/hr over 30 Minutes Intravenous  Once 07/04/17 1142 07/04/17 1323   07/04/17 1130  cefTRIAXone (ROCEPHIN) 2 g in dextrose 5 % 50 mL IVPB  Status:  Discontinued     2 g 100 mL/hr over 30 Minutes Intravenous  Once 07/04/17 1118 07/04/17 1141   07/04/17 1130  metroNIDAZOLE (FLAGYL) IVPB 500 mg  Status:  Discontinued     500 mg 100 mL/hr over 60 Minutes Intravenous  Once 07/04/17 1118 07/04/17 1141       Assessment/Plan Acute appendicitis with perforation S/p laparoscopic appendectomy 12/30 Dr. Kieth Brightly - POD 1 - pain controlled, passing flatus, tolerating liquids  ID - zosyn 12/30>> FEN - IVF, regular diet VTE - SCDs,  lovenox Foley - none Follow up - 2-3 weeks DOW clinic  Plan - Continue IVF for AKI and repeat labs in AM. Ok for regular diet as tolerated. Encourage OOB/ambulate today.  Continue IV antibiotics.   LOS: 0 days    Wellington Hampshire , [facility] Surgery 07/05/2017, 8:22 AM Pager: [phone] Consults: [phone] Mon-Fri 7:00 am-4:30 pm Sat-Sun 7:00 am-11:30 am

## 2017-07-06 ENCOUNTER — Inpatient Hospital Stay (HOSPITAL_COMMUNITY): Payer: BLUE CROSS/BLUE SHIELD

## 2017-07-06 DIAGNOSIS — K3532 Acute appendicitis with perforation and localized peritonitis, without abscess: Secondary | ICD-10-CM | POA: Diagnosis present

## 2017-07-06 DIAGNOSIS — H8110 Benign paroxysmal vertigo, unspecified ear: Secondary | ICD-10-CM

## 2017-07-06 DIAGNOSIS — R1031 Right lower quadrant pain: Secondary | ICD-10-CM | POA: Diagnosis present

## 2017-07-06 DIAGNOSIS — N179 Acute kidney failure, unspecified: Secondary | ICD-10-CM | POA: Diagnosis not present

## 2017-07-06 HISTORY — DX: Benign paroxysmal vertigo, unspecified ear: H81.10

## 2017-07-06 LAB — CBC
HCT: 34.7 % — ABNORMAL LOW (ref 39.0–52.0)
HEMOGLOBIN: 11.3 g/dL — AB (ref 13.0–17.0)
MCH: 30.2 pg (ref 26.0–34.0)
MCHC: 32.6 g/dL (ref 30.0–36.0)
MCV: 92.8 fL (ref 78.0–100.0)
PLATELETS: 184 10*3/uL (ref 150–400)
RBC: 3.74 MIL/uL — AB (ref 4.22–5.81)
RDW: 13.9 % (ref 11.5–15.5)
WBC: 9.2 10*3/uL (ref 4.0–10.5)

## 2017-07-06 LAB — BASIC METABOLIC PANEL
ANION GAP: 7 (ref 5–15)
BUN: 13 mg/dL (ref 6–20)
CHLORIDE: 105 mmol/L (ref 101–111)
CO2: 27 mmol/L (ref 22–32)
Calcium: 8.6 mg/dL — ABNORMAL LOW (ref 8.9–10.3)
Creatinine, Ser: 1.42 mg/dL — ABNORMAL HIGH (ref 0.61–1.24)
GFR, EST NON AFRICAN AMERICAN: 58 mL/min — AB (ref >60)
Glucose, Bld: 116 mg/dL — ABNORMAL HIGH (ref 65–99)
POTASSIUM: 3.7 mmol/L (ref 3.5–5.1)
SODIUM: 139 mmol/L (ref 135–145)

## 2017-07-06 MED ORDER — POLYETHYLENE GLYCOL 3350 17 G PO PACK
17.0000 g | PACK | Freq: Every day | ORAL | Status: DC
  Administered 2017-07-06: 17 g via ORAL
  Filled 2017-07-06 (×2): qty 1

## 2017-07-06 MED ORDER — DOCUSATE SODIUM 100 MG PO CAPS
100.0000 mg | ORAL_CAPSULE | Freq: Two times a day (BID) | ORAL | Status: DC
  Administered 2017-07-06 – 2017-07-07 (×2): 100 mg via ORAL
  Filled 2017-07-06 (×3): qty 1

## 2017-07-06 NOTE — Progress Notes (Signed)
Patient ID: Troy Luna, male   DOB: 03/12/1971, 47 y.o.   MRN: [id number]  [facility] Progress Note  2 Days Post-Op  Subjective: CC-  Patient reports severe right sided abdominal pain last night that radiated into his flank. Felt like "kidney pain." Patient has a h/o left sided UPJ obstruction requiring surgery ~20 years ago. He was told the left kidney functions at about 30%, but his creatinine is normal at baseline. Cr 1.42 today from 1.39. Good UOP. Denies dysuria or hematuria. Flank pain now resolved. Tolerating small amounts of diet. Passing flatus, no BM. Denies n/v. Has been ambulating in the halls.  Objective: Vital signs in last 24 hours: Temp:  [97.7 F (36.5 C)-98.6 F (37 C)] 98.6 F (37 C) (01/01 0457) Pulse Rate:  [59-69] 64 (01/01 0457) Resp:  [14-15] 14 (01/01 0457) BP: (113-144)/(69-94) 136/94 (01/01 0457) SpO2:  [99 %-100 %] 100 % (01/01 0457) Last BM Date: (PTA)  Intake/Output from previous day: 12/31 0701 - 01/01 0700 In: 1023.3 [I.V.:923.3; IV Piggyback:100] Out: 1800 [Urine:1800] Intake/Output this shift: Total I/O In: -  Out: 425 [Urine:425]  PE: Gen:  Alert, NAD, pleasant HEENT: EOM's intact, pupils equal and round Card:  RRR, no M/G/R heard Pulm:  CTAB, no W/R/R, effort normal Abd: Soft, mild distension, appropriately tender, +BS, lap incisions C/D/I except mild blanching erythema around periumbilical incision Skin: no rashes noted, warm and dry   Lab Results:  Recent Labs    07/05/17 0044 07/06/17 0450  WBC 13.3* 9.2  HGB 13.0 11.3*  HCT 38.9* 34.7*  PLT 221 184   BMET Recent Labs    07/04/17 0537 07/05/17 0044 07/06/17 0450  NA 137  --  139  K 4.0  --  3.7  CL 101  --  105  CO2 27  --  27  GLUCOSE 120*  --  116*  BUN 20  --  13  CREATININE 1.34* 1.39* 1.42*  CALCIUM 9.2  --  8.6*   PT/INR No results for input(s): LABPROT, INR in the last 72 hours. CMP     Component Value Date/Time   NA 139 07/06/2017  0450   K 3.7 07/06/2017 0450   CL 105 07/06/2017 0450   CO2 27 07/06/2017 0450   GLUCOSE 116 (H) 07/06/2017 0450   BUN 13 07/06/2017 0450   CREATININE 1.42 (H) 07/06/2017 0450   CALCIUM 8.6 (L) 07/06/2017 0450   PROT 7.2 07/04/2017 0537   ALBUMIN 4.0 07/04/2017 0537   AST 25 07/04/2017 0537   ALT 13 (L) 07/04/2017 0537   ALKPHOS 60 07/04/2017 0537   BILITOT 0.7 07/04/2017 0537   GFRNONAA 58 (L) 07/06/2017 0450   GFRAA >60 07/06/2017 0450   Lipase     Component Value Date/Time   LIPASE 38 07/04/2017 0537       Studies/Results: Ct Abdomen Pelvis W Contrast  Result Date: 07/04/2017 CLINICAL DATA:  Right lower quadrant abdominal pain onset 8 hours earlier. EXAM: CT ABDOMEN AND PELVIS WITH CONTRAST TECHNIQUE: Multidetector CT imaging of the abdomen and pelvis was performed using the standard protocol following bolus administration of intravenous contrast. CONTRAST:  126mL ISOVUE-300 IOPAMIDOL (ISOVUE-300) INJECTION 61% COMPARISON:  None. FINDINGS: Lower chest: No significant pulmonary nodules or acute consolidative airspace disease. Hepatobiliary: Normal liver size. No liver mass. Normal gallbladder with no radiopaque cholelithiasis. No biliary ductal dilatation. Pancreas: Normal, with no mass or duct dilation. Spleen: Normal size. No mass. Adrenals/Urinary Tract: Normal adrenals. Asymmetric moderate left renal atrophy. Mild-to-moderate  left hydronephrosis with prominently dilated left extrarenal pelvis with abrupt caliber transition at the left ureteropelvic junction, most compatible with a congenital UPJ stenosis. No renal masses. No right hydronephrosis. Normal bladder. Stomach/Bowel: Normal non-distended stomach. Normal caliber small bowel with no small bowel wall thickening. Markedly dilated appendix (29 mm diameter). Marked diffuse appendiceal wall thickening and hyperenhancement with prominent periappendiceal fat stranding. Findings are compatible with acute appendicitis. There are  multiple calcified appendicoliths, largest 16 mm in the proximal appendix. Appendix: Location: Right lower quadrant Diameter: 29 mm Appendicolith: Present Mucosal hyper-enhancement: Present Extraluminal gas: Not present Periappendiceal collection: Not present Normal large bowel with no diverticulosis, large bowel wall thickening or pericolonic fat stranding. Vascular/Lymphatic: Normal caliber abdominal aorta. Patent portal, splenic, hepatic and renal veins. No pathologically enlarged lymph nodes in the abdomen or pelvis. Reproductive: Normal size prostate with nonspecific internal prostatic calcifications. Other: No pneumoperitoneum, ascites or focal fluid collection. Musculoskeletal: No aggressive appearing focal osseous lesions. IMPRESSION: 1. Acute appendicitis with appendicoliths.  No abscess or free air. 2. Asymmetric left renal atrophy with findings most compatible with congenital left UPJ stenosis. Critical Value/emergent results were called by telephone at the time of interpretation on 07/04/2017 at 11:11 am to [facility], [place], who verbally acknowledged these results. Electronically Signed   By: Ilona Sorrel M.D.   On: 07/04/2017 11:12    Anti-infectives: Anti-infectives (From admission, onward)   Start     Dose/Rate Route Frequency Ordered Stop   07/05/17 0000  piperacillin-tazobactam (ZOSYN) IVPB 3.375 g     3.375 g 12.5 mL/hr over 240 Minutes Intravenous Every 8 hours 07/04/17 2344     07/04/17 2030  piperacillin-tazobactam (ZOSYN) IVPB 3.375 g     3.375 g 12.5 mL/hr over 240 Minutes Intravenous  Once 07/04/17 2015 07/04/17 2021   07/04/17 1145  piperacillin-tazobactam (ZOSYN) IVPB 3.375 g     3.375 g 100 mL/hr over 30 Minutes Intravenous  Once 07/04/17 1142 07/04/17 1323   07/04/17 1130  cefTRIAXone (ROCEPHIN) 2 g in dextrose 5 % 50 mL IVPB  Status:  Discontinued     2 g 100 mL/hr over 30 Minutes Intravenous  Once 07/04/17 1118 07/04/17 1141   07/04/17 1130  metroNIDAZOLE (FLAGYL) IVPB  500 mg  Status:  Discontinued     500 mg 100 mL/hr over 60 Minutes Intravenous  Once 07/04/17 1118 07/04/17 1141       Assessment/Plan Acute appendicitis withperforation S/p laparoscopic appendectomy 12/30 Dr. Kieth Brightly - POD 2 - AKI: Cr 1.42 from 1.39. Good UOP. Continue IVF.  ID - zosyn 12/30>>day#3 FEN - IVF, regular diet VTE - SCDs, lovenox Foley - none Follow up - 2-3 weeks DOW clinic  Plan - Cr still elevated, good UOP. Continue IVF and IV antibiotics today. Check renal u/s. BMP in AM. Monitor periumbilical incision (erythematous).   LOS: 0 days    Wellington Hampshire , [facility] Surgery 07/06/2017, 8:32 AM Pager: [phone] Consults: [phone] Mon-Fri 7:00 am-4:30 pm Sat-Sun 7:00 am-11:30 am

## 2017-07-07 LAB — BASIC METABOLIC PANEL
Anion gap: 7 (ref 5–15)
BUN: 11 mg/dL (ref 6–20)
CO2: 25 mmol/L (ref 22–32)
Calcium: 8.5 mg/dL — ABNORMAL LOW (ref 8.9–10.3)
Chloride: 103 mmol/L (ref 101–111)
Creatinine, Ser: 1.48 mg/dL — ABNORMAL HIGH (ref 0.61–1.24)
GFR calc Af Amer: >60 mL/min (ref >60)
GFR calc non Af Amer: 55 mL/min — ABNORMAL LOW (ref >60)
Glucose, Bld: 109 mg/dL — ABNORMAL HIGH (ref 65–99)
Potassium: 3.5 mmol/L (ref 3.5–5.1)
Sodium: 135 mmol/L (ref 135–145)

## 2017-07-07 MED ORDER — AMOXICILLIN-POT CLAVULANATE 875-125 MG PO TABS: 1.0000 | ORAL_TABLET | Freq: Two times a day (BID) | ORAL | 0 refills | Status: AC

## 2017-07-07 MED ORDER — HYDROCODONE-ACETAMINOPHEN 5-325 MG PO TABS: 1.0000 | ORAL_TABLET | Freq: Four times a day (QID) | ORAL | 0 refills | Status: DC | PRN

## 2017-07-07 NOTE — Care Management Note (Signed)
Case Management Note  Patient Details  Name: Troy Luna MRN: [id number] Date of Birth: 1970/11/06  Subjective/Objective:                    Action/Plan: Pt discharged home with self care. Pt has PCP, insurance and transportation home. No further needs per CM.  Expected Discharge Date:  07/07/17               Expected Discharge Plan:  Home/Self Care  In-House Referral:     Discharge planning Services     Post Acute Care Choice:    Choice offered to:     DME Arranged:    DME Agency:     HH Arranged:    HH Agency:     Status of Service:  Completed, signed off  If discussed at [employer] of Stay Meetings, dates discussed:    Additional Comments:  Pollie Friar, RN 07/07/2017, 2:27 PM

## 2017-07-07 NOTE — Progress Notes (Signed)
Troy Luna to be D/C'd Home per MD order.  Discussed with the patient and all questions fully answered.  VSS, Skin clean, dry and intact without evidence of skin break down, no evidence of skin tears noted. IV catheter discontinued intact. Site without signs and symptoms of complications. Dressing and pressure applied.  An After Visit Summary was printed and given to the patient. Patient received prescription.  D/c education completed with patient/family including follow up instructions, medication list, d/c activities limitations if indicated, with other d/c instructions as indicated by MD - patient able to verbalize understanding, all questions fully answered.   Patient instructed to return to ED, call 911, or call MD for any changes in condition.   Patient escorted via [place], and D/C home via private auto.  Richardean Chimera 07/07/2017 10:33 AM

## 2017-07-07 NOTE — Discharge Summary (Signed)
[facility] Discharge Summary   Patient ID: Troy Luna MRN: [id number] DOB/AGE: 07-Aug-1970 47 y.o.  Admit date: 07/04/2017 Discharge date: 07/07/2017  Admitting Diagnosis: Acute appendicitis  Discharge Diagnosis Patient Active Problem List   Diagnosis Date Noted  . Acute appendicitis 07/04/2017    Consultants None  Imaging: US Renal  Result Date: 07/06/2017 CLINICAL DATA:  Acute kidney injury. EXAM: RENAL / URINARY TRACT ULTRASOUND COMPLETE COMPARISON:  CT abdomen pelvis dated July 04, 2017. FINDINGS: Right Kidney: Length: 12.9 cm. Echogenicity within normal limits. No mass or hydronephrosis visualized. Left Kidney: Length: 10.3 cm. Echogenicity within normal limits. Mild atrophy. Mild hydronephrosis and mildly dilated extrarenal pelvis. Bladder: Appears normal for degree of bladder distention. IMPRESSION: 1. Unchanged left renal atrophy and mild hydronephrosis, likely secondary to congenital UPJ stenosis. Electronically Signed   By: Troy Luna M.D.   On: 07/06/2017 13:46    Procedures Dr. Kieth Luna (07/04/17) - Laparoscopic appendectomy  Hospital Course:  Troy Luna is a 47yo male PMH prior left UPJ obstruction requiring surgery, who presented to [facility] 12/30 with 1 day of abdominal pain.  Workup showed acute appendicitis.  Patient was admitted and underwent procedure listed above.  Intraoperatively he was found to have perforated appendicitis. Tolerated procedure well and was transferred to the floor.  Diet was advanced as tolerated.  Creatinine remained elevated during admission, renal u/s showed no acute kidney changes. On POD3, the patient was voiding well, tolerating diet, ambulating well, pain well controlled, vital signs stable and felt stable for discharge home.  He will go home on 5 days of augmentin and follow up in our office next week to check periumbilical incision cellulitis. He knows to call with questions or concerns.  He will see his PCP in  1 week to check creatinine, and will be referred to urology.  I have personally reviewed the patients medication history on the [place] controlled substance database.   Physical Exam: Gen: Alert, NAD, pleasant HEENT: EOM's intact, pupils equal and round Card: RRR, no M/G/R heard Pulm: CTAB, no W/R/R, effort normal Abd: Soft,mild distension,appropriately tender,+BS,lapincisions C/D/I except mild blanching erythema around periumbilical incision (cellulitis has not increased from yesterday) Skin: no rashes noted, warm and dry  Allergies as of 07/07/2017   No Known Allergies     Medication List    STOP taking these medications   ibuprofen 200 MG tablet Commonly known as:  ADVIL,MOTRIN     TAKE these medications   amoxicillin-clavulanate 875-125 MG tablet Commonly known as:  AUGMENTIN Take 1 tablet by mouth 2 (two) times daily for 5 days.   HYDROcodone-acetaminophen 5-325 MG tablet Commonly known as:  NORCO/VICODIN Take 1 tablet by mouth every 6 (six) hours as needed for severe pain.        Follow-up [facility], [place]. Go on 07/13/2017.   Specialty:  General Surgery Why:  Your appointment is 07/13/16 at 11 am. Bring photo ID and insurance information. Arrive 30 minutes prior to appointment to check in and fill out paperwork. Contact information: [address] [place] [phone]       Darcus Austin, MD. Call in 1 week(s).   Specialty:  Family Medicine Why:  please call as soon as you leave the hospital to make a follow up appointment in 1 week to check your creatinine Contact information: [address] [place] [address] [phone]           Signed:  [name], [facility] 07/07/2017, 9:51 AM Pager: [phone] Consults: [phone] Mon-Fri 7:00 am-4:30 pm Sat-Sun 7:00 am-11:30 am

## 2017-07-14 DIAGNOSIS — I10 Essential (primary) hypertension: Secondary | ICD-10-CM | POA: Diagnosis not present

## 2017-07-14 DIAGNOSIS — Q6211 Congenital occlusion of ureteropelvic junction: Secondary | ICD-10-CM | POA: Diagnosis not present

## 2017-07-18 DIAGNOSIS — I10 Essential (primary) hypertension: Secondary | ICD-10-CM | POA: Diagnosis not present

## 2017-07-18 DIAGNOSIS — R42 Dizziness and giddiness: Secondary | ICD-10-CM | POA: Diagnosis not present

## 2017-07-19 DIAGNOSIS — R42 Dizziness and giddiness: Secondary | ICD-10-CM | POA: Diagnosis not present

## 2017-07-19 DIAGNOSIS — M79602 Pain in left arm: Secondary | ICD-10-CM | POA: Diagnosis not present

## 2017-07-19 DIAGNOSIS — M542 Cervicalgia: Secondary | ICD-10-CM | POA: Diagnosis not present

## 2017-07-29 DIAGNOSIS — Q6211 Congenital occlusion of ureteropelvic junction: Secondary | ICD-10-CM | POA: Diagnosis not present

## 2017-08-04 DIAGNOSIS — I10 Essential (primary) hypertension: Secondary | ICD-10-CM | POA: Diagnosis not present

## 2017-08-04 DIAGNOSIS — R202 Paresthesia of skin: Secondary | ICD-10-CM | POA: Diagnosis not present

## 2017-08-12 ENCOUNTER — Ambulatory Visit: Payer: BLUE CROSS/BLUE SHIELD | Attending: Family Medicine | Admitting: Physical Therapy

## 2017-08-12 ENCOUNTER — Encounter: Payer: Self-pay | Admitting: Physical Therapy

## 2017-08-12 ENCOUNTER — Other Ambulatory Visit: Payer: Self-pay

## 2017-08-12 DIAGNOSIS — R42 Dizziness and giddiness: Secondary | ICD-10-CM | POA: Diagnosis not present

## 2017-08-12 DIAGNOSIS — H8113 Benign paroxysmal vertigo, bilateral: Secondary | ICD-10-CM | POA: Insufficient documentation

## 2017-08-12 NOTE — Patient Instructions (Addendum)
  Provided patient handout re: BPPV  Provided handouts for Epley maneuver left and right; Brandt-Daroff

## 2017-08-12 NOTE — Therapy (Signed)
[facility] [address] [place], [place], [address] Phone: [phone]   Fax:  [phone]  Physical Therapy Evaluation  Patient Details  Name: Troy Luna MRN: [id number] Date of Birth: 04-17-1971 Referring Provider: Darcus Austin, MD   Encounter Date: 08/12/2017  PT End of Session - 08/12/17 1907    Visit Number  1    Number of Visits  3    Authorization Type  BCBS    Authorization - Visit Number  1    Authorization - Number of Visits  30 PT, OT, chiropractor combined    PT Start Time  6962    PT Stop Time  1400    PT Time Calculation (min)  45 min    Activity Tolerance  Patient tolerated treatment well    Behavior During Therapy  [facility] for tasks assessed/performed       Past Medical History:  Diagnosis Date  . Hypertension     Past Surgical History:  Procedure Laterality Date  . KIDNEY SURGERY Left   . LAPAROSCOPIC APPENDECTOMY N/A 07/04/2017   Procedure: APPENDECTOMY LAPAROSCOPIC;  Surgeon: Kinsinger, Arta Bruce, MD;  Location: [facility];  Service: General;  Laterality: N/A;    There were no vitals filed for this visit.   Subjective Assessment - 08/12/17 1321    Subjective  Started 07/12/17. Had my appendix out 12/30, in hospital a few days due to partial rupture. After discharged started a new medicine for his BP and developed severe headache. (MD stopped the medicine). That night went to bed and had spinning and nausea. I've had it every day since then. Only when I lie down and roll onto my right side.      Pertinent History  HTN, new onset tingling of feet and hands (since discharged from hospital)    Patient Stated Goals  Stop having spinning when lying down or rolling over in bed    Currently in Pain?  No/denies         [facility] PT Assessment - 08/12/17 1317      Assessment   Medical Diagnosis  vertigo    Referring Provider  Darcus Austin, MD    Onset Date/Surgical Date  07/12/17    Prior Therapy   none         Vestibular Assessment - 08/12/17 1325      Vestibular Assessment   General Observation  ambulating without LOB or drifting off path      Symptom Behavior   Type of Dizziness  Spinning    Frequency of Dizziness  daily    Duration of Dizziness  5-10 seconds    Aggravating Factors  Rolling to right;Lying supine    Relieving Factors  Closing eyes;Rest      Occulomotor Exam   Occulomotor Alignment  Normal    Spontaneous  Absent    Gaze-induced  Absent      Positional Testing   Dix-Hallpike  Dix-Hallpike Right;Dix-Hallpike Left    Sidelying Test  Sidelying Right;Sidelying Left    Horizontal Canal Testing  Horizontal Canal Right;Horizontal Canal Left      Dix-Hallpike Right   Dix-Hallpike Right Duration  1 second    Dix-Hallpike Right Symptoms  No nystagmus reports sense of ceiling spinning      Dix-Hallpike Left   Dix-Hallpike Left Duration  0    Dix-Hallpike Left Symptoms  No nystagmus      Sidelying Right   Sidelying Right Duration  0    Sidelying Right  Symptoms  No nystagmus      Sidelying Left   Sidelying Left Duration  1 sec    Sidelying Left Symptoms  No nystagmus + "slow spin"      Horizontal Canal Right   Horizontal Canal Right Duration  0    Horizontal Canal Right Symptoms  Normal      Horizontal Canal Left   Horizontal Canal Left Duration  0    Horizontal Canal Left Symptoms  Normal         Objective measurements completed on examination: See above findings.       Vestibular Treatment/Exercise - 08/12/17 0001      Vestibular Treatment/Exercise   Vestibular Treatment Provided  Canalith Repositioning;Habituation    Canalith Repositioning  Epley Manuever Right;Epley Manuever Left    Habituation Exercises  Brandt Daroff       EPLEY MANUEVER RIGHT   Number of Reps   3    Overall Response  Improved Symptoms    Response Details   strongest vertigo position 3; improved each rep       EPLEY MANUEVER LEFT   Number of Reps   2     Overall Response   Improved Symptoms     RESPONSE DETAILS LEFT  strongest vertigo position 3; improved each rep      Nestor Lewandowsky   Symptom Description   educated and provided handout (if symptoms persist)            PT Education - 08/12/17 1905    Education provided  Yes    Education Details  what BPPV is; causes; treatment; recurrence; hydration;     Person(s) Educated  Patient    Methods  Explanation;Demonstration;Handout    Comprehension  Verbalized understanding;Returned demonstration          PT Long Term Goals - 08/12/17 1915      PT LONG TERM GOAL #1   Title  Patient will have negative positioning testing (Target for all LTGs 08/26/17)    Time  2    Period  Weeks    Status  New      PT LONG TERM GOAL #2   Title  Patient will demonstrate independence with HEP/habituation exercises.     Time  2    Period  Weeks    Status  New             Plan - 08/12/17 1909    Clinical Impression Statement  Patient referred for PT evaluation due to vertigo. History and assessment consistent with BPPV (bil) and progressed to treatment via Epley maneuvers. Patient progressed to no vertigo after repeated maneuvers. At end of session was able to move into supine and roll to his right to simulate his way of getting into bed and had no vertigo. Due to patient having bil BPPV and required multiple repetitions of Epley maneuver, will schedule 2 additional visits in case patient continues to have symptoms. Patient aware to call and cancel appointments if he is symptom  free.     History and Personal Factors relevant to plan of care:  HTN, new onset tingling of hands and feet    Clinical Presentation  Stable    Clinical Presentation due to:  symptoms had slowly improved since onset     Clinical Decision Making  Low    Rehab Potential  Good    PT Frequency  1x / week    PT Duration  2 weeks    PT Treatment/Interventions  ADLs/Self Care Home Management;Canalith  Repostioning;Therapeutic activities;Therapeutic exercise;Balance training;Neuromuscular re-education;Patient/family education;Vestibular;Visual/perceptual remediation/compensation    PT Next Visit Plan  reassess bil posterior canals for BPPV; check for hypofunction    PT Home Exercise Plan  given Brandt-Daroff    Consulted and Agree with Plan of Care  Patient       Patient will benefit from skilled therapeutic intervention in order to improve the following deficits and impairments:  Decreased activity tolerance, Decreased mobility, Dizziness  Visit Diagnosis: BPPV (benign paroxysmal positional vertigo), bilateral - Plan: PT plan of care cert/re-cert  Dizziness and giddiness - Plan: PT plan of care cert/re-cert     Problem List Patient Active Problem List   Diagnosis Date Noted  . Acute appendicitis 07/04/2017    Rexanne Mano, PT 08/12/2017, 7:21 PM  [facility] [address] [place], [place], [address] Phone: [phone]   Fax:  [phone]  Name: Troy Luna MRN: [id number] Date of Birth: 1970-09-22

## 2017-08-17 ENCOUNTER — Ambulatory Visit: Payer: BLUE CROSS/BLUE SHIELD | Admitting: Physical Therapy

## 2017-08-23 ENCOUNTER — Ambulatory Visit: Payer: BLUE CROSS/BLUE SHIELD | Admitting: Physical Therapy

## 2017-09-03 ENCOUNTER — Ambulatory Visit: Payer: BLUE CROSS/BLUE SHIELD | Attending: Family Medicine | Admitting: Physical Therapy

## 2017-09-03 ENCOUNTER — Encounter: Payer: Self-pay | Admitting: Physical Therapy

## 2017-09-03 DIAGNOSIS — R42 Dizziness and giddiness: Secondary | ICD-10-CM | POA: Diagnosis not present

## 2017-09-03 DIAGNOSIS — R2681 Unsteadiness on feet: Secondary | ICD-10-CM | POA: Insufficient documentation

## 2017-09-03 NOTE — Patient Instructions (Signed)
Gaze Stabilization: Tip Card  1.Target must remain in focus, not blurry, and appear stationary while head is in motion. 2.Perform exercises with small head movements (45 to either side of midline). 3.Increase speed of head motion so long as target is in focus. 4.If you wear eyeglasses, be sure you can see target through lens (therapist will give specific instructions for bifocal / progressive lenses). 5.These exercises may provoke dizziness or nausea. Work through these symptoms. If too dizzy, slow head movement slightly. Rest between each exercise. 6.Exercises demand concentration; avoid distractions.  Copyright  VHI. All rights reserved.     Special Instructions: Exercises may bring on mild to moderate symptoms of dizziness that resolve within 30 minutes of completing exercises. If symptoms are lasting longer than 30 minutes, modify your exercises by:  >decreasing the # of times you complete each activity >ensuring your symptoms return to baseline before moving onto the next exercise >dividing up exercises so you do not do them all in one session, but multiple short sessions throughout the day >doing them once a day until symptoms improve   Gaze Stabilization: Sitting    Keeping eyes on target on wall 3 feet away, and move head side to side for _30-60___ seconds. Rest and repeat for a total of 3 repetitions. Do __2-3__ sessions per day.  Copyright  VHI. All rights reserved.    Gaze Stabilization - Tip Card  For safety, perform standing exercises close to a counter, wall, corner, or next to someone.   Gaze Stabilization - Standing Feet Apart   Feet shoulder width apart, keeping eyes on target on wall 3 feet away, tilt head down slightly and move head side to side for 30 seconds. *Work up to tolerating 60 seconds, as able. Do 2-3 sessions per day.   Copyright  VHI. All rights reserved.

## 2017-09-03 NOTE — Therapy (Signed)
[facility] [address] [place], [place], [address] Phone: [phone]   Fax:  [phone]  Physical Therapy Treatment  Patient Details  Name: Troy Luna MRN: [id number] Date of Birth: 02-13-1971 Referring Provider: Darcus Austin, MD   Encounter Date: 09/03/2017  PT End of Session - 09/03/17 1340    Visit Number  2    Number of Visits  7 recert 3/1    Authorization Type  BCBS    Authorization - Visit Number  2    Authorization - Number of Visits  30 PT, OT, chiropractor combined    PT Start Time  0803    PT Stop Time  0845    PT Time Calculation (min)  42 min    Activity Tolerance  Patient tolerated treatment well    Behavior During Therapy  [facility] for tasks assessed/performed       Past Medical History:  Diagnosis Date  . Hypertension     Past Surgical History:  Procedure Laterality Date  . KIDNEY SURGERY Left   . LAPAROSCOPIC APPENDECTOMY N/A 07/04/2017   Procedure: APPENDECTOMY LAPAROSCOPIC;  Surgeon: Kinsinger, Arta Bruce, MD;  Location: [facility];  Service: General;  Laterality: N/A;    There were no vitals filed for this visit.  Subjective Assessment - 09/03/17 0804    Subjective  Went about 8 days after eval with no symptoms. Then began noticing when lying on his back and head turned to the right, he'll have an episode lasting <1 minute. Another bigger episode looking under the seat of the car and lasted <1 minute "but felt awful"    Pertinent History  HTN, new onset tingling of feet and hands (since discharged from hospital)    Patient Stated Goals  Stop having spinning when lying down or rolling over in bed    Currently in Pain?  No/denies             Vestibular Assessment - 09/03/17 0848      Symptom Behavior   Type of Dizziness  Spinning    Frequency of Dizziness  daily    Duration of Dizziness  5-10 seconds    Aggravating Factors  Turning head sideways;Comment looking down head turned  left (under passenger car seat)      Occulomotor Exam   Occulomotor Alignment  Normal    Spontaneous  Absent    Gaze-induced  Absent      Vestibulo-Occular Reflex   VOR 1 Head Only (x 1 viewing)  20 seconds with target moving    Comment  HIT + to his right, indicative of left hypofunction      Positional Testing   Dix-Hallpike  Dix-Hallpike Right;Dix-Hallpike Left    Horizontal Canal Testing  Horizontal Canal Right;Horizontal Canal Left      Dix-Hallpike Right   Dix-Hallpike Right Duration  0    Dix-Hallpike Right Symptoms  No nystagmus      Dix-Hallpike Left   Dix-Hallpike Left Duration  0    Dix-Hallpike Left Symptoms  No nystagmus      Horizontal Canal Right   Horizontal Canal Right Duration  0    Horizontal Canal Right Symptoms  Normal      Horizontal Canal Left   Horizontal Canal Left Duration  0    Horizontal Canal Left Symptoms  Normal               Vestibular Treatment/Exercise - 09/03/17 6503      Vestibular Treatment/Exercise  Vestibular Treatment Provided  Gaze    Gaze Exercises  X1 Viewing Horizontal *not symptomatic with vertical x 1      X1 Viewing Horizontal   Foot Position  seated at edge of seat    Time  -- only tolerated 20 sec; target began to move & 5/10 dizzy    Reps  3            PT Education - 09/03/17 1337    Education provided  Yes    Education Details  what a hypofunction is and what can cause it; HEP for VOR and how to progress; single leg stance, tandem walk for HEP    Person(s) Educated  Patient    Methods  Explanation;Demonstration;Handout    Comprehension  Verbalized understanding;Returned demonstration          PT Long Term Goals - 09/03/17 1346      PT LONG TERM GOAL #1   Title  Patient will have negative positioning testing (Target for all LTGs 08/26/17)    Baseline  3/1 negative testing all canals    Time  2    Period  Weeks    Status  Achieved      PT LONG TERM GOAL #2   Title  Patient will  demonstrate independence with HEP/habituation exercises.     Baseline  3/1 Brandt-Daroff only had been given; now needs further ex's to address hypofunction    Time  2    Period  Weeks    Status  Achieved      PT LONG TERM GOAL #3   Title  Patient will be able to perform VORx1 in standing with feet together x 60 seconds and symptoms <=2/10.    Baseline  3/1 VOR x1 for 20 sec with symptoms 4-5/10    Time  4    Period  Weeks    Status  New      PT LONG TERM GOAL #4   Title  Patient will complete DGI vs Berg for balance assessment and goal set as appropriate.     Time  1    Period  Weeks    Status  New            Plan - 09/03/17 1341    Clinical Impression Statement  Patient cancelled initial follow-up appointments as his vertigo had resolved. Returned today due to return of milder vertigo. Testing + for vestibular hypofunction on the left. Educated in VORx1 exercise and how to progress exercises (or curb them if causing symptoms >5/10 or lasting more than 30 minutes after completing). Will send re-certification to Dr. Inda Merlin as pt will need additional visits. Patient also mentioned he has had a decline in his balance; denied change in hearing.     Rehab Potential  Good    PT Frequency  1x / week    PT Duration  4 weeks    PT Treatment/Interventions  ADLs/Self Care Home Management;Canalith Repostioning;Therapeutic activities;Therapeutic exercise;Balance training;Neuromuscular re-education;Patient/family education;Vestibular;Visual/perceptual remediation/compensation    PT Next Visit Plan  assess tolerance for VORx1 (has he progressed to 60 sec? to standing?); assess balance with DGI or Berg;     PT Home Exercise Plan  given Brandt-Daroff; given VORx1    Consulted and Agree with Plan of Care  Patient       Patient will benefit from skilled therapeutic intervention in order to improve the following deficits and impairments:  Decreased activity tolerance, Decreased mobility, Dizziness,  Decreased balance  Visit Diagnosis:  Dizziness and giddiness - Plan: PT plan of care cert/re-cert  Unsteadiness on feet - Plan: PT plan of care cert/re-cert     Problem List Patient Active Problem List   Diagnosis Date Noted  . Acute appendicitis 07/04/2017    Rexanne Mano, PT 09/03/2017, 1:53 PM  [facility] [address] [place], [place], [address] Phone: [phone]   Fax:  [phone]  Name: Troy Luna MRN: [id number] Date of Birth: [birth year]

## 2017-09-16 ENCOUNTER — Other Ambulatory Visit: Payer: Self-pay | Admitting: Student

## 2017-09-16 DIAGNOSIS — Z9049 Acquired absence of other specified parts of digestive tract: Secondary | ICD-10-CM

## 2017-09-17 ENCOUNTER — Encounter: Payer: Self-pay | Admitting: Physical Therapy

## 2017-09-20 ENCOUNTER — Ambulatory Visit
Admission: RE | Admit: 2017-09-20 | Discharge: 2017-09-20 | Disposition: A | Discharge status: Home / Self Care | Payer: BLUE CROSS/BLUE SHIELD | Source: Ambulatory Visit | Attending: Student | Admitting: Student

## 2017-09-20 DIAGNOSIS — Z9049 Acquired absence of other specified parts of digestive tract: Secondary | ICD-10-CM

## 2017-09-20 DIAGNOSIS — K439 Ventral hernia without obstruction or gangrene: Secondary | ICD-10-CM | POA: Diagnosis not present

## 2017-09-22 DIAGNOSIS — K432 Incisional hernia without obstruction or gangrene: Secondary | ICD-10-CM | POA: Diagnosis not present

## 2017-09-23 ENCOUNTER — Ambulatory Visit: Payer: Self-pay | Admitting: General Surgery

## 2017-09-23 ENCOUNTER — Other Ambulatory Visit: Payer: BLUE CROSS/BLUE SHIELD

## 2017-09-23 ENCOUNTER — Encounter (HOSPITAL_BASED_OUTPATIENT_CLINIC_OR_DEPARTMENT_OTHER): Payer: Self-pay | Admitting: *Deleted

## 2017-09-24 ENCOUNTER — Encounter (HOSPITAL_BASED_OUTPATIENT_CLINIC_OR_DEPARTMENT_OTHER): Payer: Self-pay | Admitting: *Deleted

## 2017-09-24 ENCOUNTER — Other Ambulatory Visit: Payer: Self-pay

## 2017-09-24 NOTE — Progress Notes (Addendum)
Npo after midnight food NO SOLID FOOD AFTER MIDNIGHT THE NIGHT PRIOR TO SURGERY. NOTHING BY MOUTH EXCEPT CLEAR LIQUIDS UNTIL 3 HOURS PRIOR TO [place] SURGERY. PLEASE FINISH ENSURE DRINK PER SURGEON ORDER 3 HOURS PRIOR TO SCHEDULED SURGERY TIME WHICH NEEDS TO BE COMPLETED AT 430 am. Patient instructed eras protocol by phone and vocalized understanding.  meds to take none ekg 07-18-17 [place] on chart lov dr Junious Silk 07-29-2017 on chart Wife lesley masur coming to drive patient home after taking child to school leslie cell [phone] Patient wife lesley coming to pick up eras drink x 1 Monday 09-27-17 830 am  needs I stat 4 Has pre op orders

## 2017-09-27 ENCOUNTER — Encounter: Payer: Self-pay | Admitting: Physical Therapy

## 2017-09-27 NOTE — Anesthesia Preprocedure Evaluation (Signed)
Anesthesia Evaluation  Patient identified by MRN, date of birth, ID band Patient awake    Reviewed: Allergy & Precautions, NPO status , Patient's Chart, lab work & pertinent test results  Airway Mallampati: II  TM Distance: >3 FB Neck ROM: Full    Dental no notable dental hx. (+) Teeth Intact, Dental Advisory Given   Pulmonary neg pulmonary ROS,    Pulmonary exam normal breath sounds clear to auscultation       Cardiovascular hypertension, negative cardio ROS Normal cardiovascular exam Rhythm:Regular Rate:Normal     Neuro/Psych negative neurological ROS  negative psych ROS   GI/Hepatic negative GI ROS, Neg liver ROS,   Endo/Other  negative endocrine ROS  Renal/GU Renal disease     Musculoskeletal negative musculoskeletal ROS (+)   Abdominal   Peds  Hematology negative hematology ROS (+)   Anesthesia Other Findings   Reproductive/Obstetrics negative OB ROS                             Anesthesia Physical  Anesthesia Plan  ASA: II  Anesthesia Plan: General   Post-op Pain Management:    Induction: Intravenous  PONV Risk Score and Plan: 2 and Ondansetron, Dexamethasone and Treatment may vary due to age or medical condition  Airway Management Planned: Oral ETT  Additional Equipment:   Intra-op Plan:   Post-operative Plan: Extubation in OR  Informed Consent: I have reviewed the patients History and Physical, chart, labs and discussed the procedure including the risks, benefits and alternatives for the proposed anesthesia with the patient or authorized representative who has indicated his/her understanding and acceptance.   Dental advisory given  Plan Discussed with: CRNA  Anesthesia Plan Comments:         Anesthesia Quick Evaluation

## 2017-09-28 ENCOUNTER — Other Ambulatory Visit: Payer: Self-pay

## 2017-09-28 ENCOUNTER — Encounter (HOSPITAL_BASED_OUTPATIENT_CLINIC_OR_DEPARTMENT_OTHER): Payer: Self-pay

## 2017-09-28 ENCOUNTER — Ambulatory Visit (HOSPITAL_BASED_OUTPATIENT_CLINIC_OR_DEPARTMENT_OTHER): Payer: BLUE CROSS/BLUE SHIELD | Admitting: Anesthesiology

## 2017-09-28 ENCOUNTER — Ambulatory Visit (HOSPITAL_BASED_OUTPATIENT_CLINIC_OR_DEPARTMENT_OTHER)
Admission: RE | Admit: 2017-09-28 | Discharge: 2017-09-28 | Disposition: A | Discharge status: Home / Self Care | Payer: BLUE CROSS/BLUE SHIELD | Source: Ambulatory Visit | Attending: General Surgery | Admitting: General Surgery

## 2017-09-28 ENCOUNTER — Encounter (HOSPITAL_BASED_OUTPATIENT_CLINIC_OR_DEPARTMENT_OTHER)
Admission: RE | Disposition: A | Discharge status: Home / Self Care | Payer: Self-pay | Source: Ambulatory Visit | Attending: General Surgery

## 2017-09-28 DIAGNOSIS — I129 Hypertensive chronic kidney disease with stage 1 through stage 4 chronic kidney disease, or unspecified chronic kidney disease: Secondary | ICD-10-CM | POA: Diagnosis not present

## 2017-09-28 DIAGNOSIS — K432 Incisional hernia without obstruction or gangrene: Secondary | ICD-10-CM | POA: Diagnosis not present

## 2017-09-28 DIAGNOSIS — N189 Chronic kidney disease, unspecified: Secondary | ICD-10-CM | POA: Diagnosis not present

## 2017-09-28 DIAGNOSIS — Z91048 Other nonmedicinal substance allergy status: Secondary | ICD-10-CM | POA: Diagnosis not present

## 2017-09-28 DIAGNOSIS — I10 Essential (primary) hypertension: Secondary | ICD-10-CM | POA: Diagnosis not present

## 2017-09-28 DIAGNOSIS — Z79899 Other long term (current) drug therapy: Secondary | ICD-10-CM | POA: Insufficient documentation

## 2017-09-28 HISTORY — PX: INSERTION OF MESH: SHX5868

## 2017-09-28 HISTORY — DX: Benign paroxysmal vertigo, unspecified ear: H81.10

## 2017-09-28 HISTORY — PX: INCISIONAL HERNIA REPAIR: SHX193

## 2017-09-28 HISTORY — DX: Incisional hernia without obstruction or gangrene: K43.2

## 2017-09-28 HISTORY — DX: Chronic kidney disease, unspecified: N18.9

## 2017-09-28 LAB — POCT I-STAT 4, (NA,K, GLUC, HGB,HCT)
Glucose, Bld: 76 mg/dL (ref 65–99)
HCT: 42 % (ref 39.0–52.0)
HEMOGLOBIN: 14.3 g/dL (ref 13.0–17.0)
Potassium: 3.6 mmol/L (ref 3.5–5.1)
Sodium: 142 mmol/L (ref 135–145)

## 2017-09-28 SURGERY — REPAIR, HERNIA, INCISIONAL
Anesthesia: General

## 2017-09-28 MED ORDER — DEXAMETHASONE SODIUM PHOSPHATE 4 MG/ML IJ SOLN
INTRAMUSCULAR | Status: DC | PRN
  Administered 2017-09-28: 10 mg via INTRAVENOUS

## 2017-09-28 MED ORDER — FENTANYL CITRATE (PF) 100 MCG/2ML IJ SOLN
INTRAMUSCULAR | Status: AC
  Filled 2017-09-28: qty 2

## 2017-09-28 MED ORDER — CEFAZOLIN SODIUM-DEXTROSE 2-4 GM/100ML-% IV SOLN
INTRAVENOUS | Status: AC
  Filled 2017-09-28: qty 100

## 2017-09-28 MED ORDER — PROMETHAZINE HCL 25 MG/ML IJ SOLN
6.2500 mg | INTRAMUSCULAR | Status: DC | PRN
  Filled 2017-09-28: qty 1

## 2017-09-28 MED ORDER — IBUPROFEN 800 MG PO TABS: 800.0000 mg | ORAL_TABLET | Freq: Three times a day (TID) | ORAL | 0 refills | Status: DC | PRN

## 2017-09-28 MED ORDER — BUPIVACAINE LIPOSOME 1.3 % IJ SUSP
INTRAMUSCULAR | Status: DC | PRN
  Administered 2017-09-28: 50 mL

## 2017-09-28 MED ORDER — GABAPENTIN 300 MG PO CAPS
300.0000 mg | ORAL_CAPSULE | ORAL | Status: AC
  Administered 2017-09-28: 300 mg via ORAL
  Filled 2017-09-28: qty 1

## 2017-09-28 MED ORDER — ONDANSETRON HCL 4 MG/2ML IJ SOLN
INTRAMUSCULAR | Status: DC | PRN
  Administered 2017-09-28: 4 mg via INTRAVENOUS

## 2017-09-28 MED ORDER — ROCURONIUM BROMIDE 100 MG/10ML IV SOLN
INTRAVENOUS | Status: DC | PRN
  Administered 2017-09-28: 50 mg via INTRAVENOUS

## 2017-09-28 MED ORDER — ENSURE PRE-SURGERY PO LIQD
296.0000 mL | Freq: Once | ORAL | Status: DC
  Filled 2017-09-28: qty 296

## 2017-09-28 MED ORDER — BUPIVACAINE HCL 0.5 % IJ SOLN: INTRAMUSCULAR | Status: DC | PRN

## 2017-09-28 MED ORDER — EPHEDRINE 5 MG/ML INJ
INTRAVENOUS | Status: AC
  Filled 2017-09-28: qty 10

## 2017-09-28 MED ORDER — PROPOFOL 10 MG/ML IV BOLUS
INTRAVENOUS | Status: DC | PRN
  Administered 2017-09-28: 200 mg via INTRAVENOUS

## 2017-09-28 MED ORDER — LIDOCAINE 2% (20 MG/ML) 5 ML SYRINGE
INTRAMUSCULAR | Status: DC | PRN
  Administered 2017-09-28: 100 mg via INTRAVENOUS

## 2017-09-28 MED ORDER — CELECOXIB 200 MG PO CAPS
ORAL_CAPSULE | ORAL | Status: AC
  Filled 2017-09-28: qty 2

## 2017-09-28 MED ORDER — CELECOXIB 400 MG PO CAPS
400.0000 mg | ORAL_CAPSULE | ORAL | Status: AC
  Administered 2017-09-28: 400 mg via ORAL
  Filled 2017-09-28: qty 1

## 2017-09-28 MED ORDER — OXYCODONE HCL 5 MG PO TABS
5.0000 mg | ORAL_TABLET | Freq: Once | ORAL | Status: AC
  Administered 2017-09-28: 5 mg via ORAL
  Filled 2017-09-28: qty 1

## 2017-09-28 MED ORDER — PROPOFOL 500 MG/50ML IV EMUL
INTRAVENOUS | Status: AC
  Filled 2017-09-28: qty 50

## 2017-09-28 MED ORDER — DEXAMETHASONE SODIUM PHOSPHATE 10 MG/ML IJ SOLN
INTRAMUSCULAR | Status: AC
  Filled 2017-09-28: qty 1

## 2017-09-28 MED ORDER — MEPERIDINE HCL 25 MG/ML IJ SOLN
6.2500 mg | INTRAMUSCULAR | Status: DC | PRN
  Filled 2017-09-28: qty 1

## 2017-09-28 MED ORDER — MIDAZOLAM HCL 5 MG/5ML IJ SOLN
INTRAMUSCULAR | Status: DC | PRN
  Administered 2017-09-28: 2 mg via INTRAVENOUS

## 2017-09-28 MED ORDER — ACETAMINOPHEN 500 MG PO TABS
ORAL_TABLET | ORAL | Status: AC
Start: 2017-09-28 — End: ?
  Filled 2017-09-28: qty 2

## 2017-09-28 MED ORDER — CEFAZOLIN SODIUM-DEXTROSE 2-4 GM/100ML-% IV SOLN
2.0000 g | INTRAVENOUS | Status: AC
  Administered 2017-09-28: 2 g via INTRAVENOUS
  Filled 2017-09-28: qty 100

## 2017-09-28 MED ORDER — OXYCODONE HCL 5 MG PO TABS
ORAL_TABLET | ORAL | Status: AC
  Filled 2017-09-28: qty 1

## 2017-09-28 MED ORDER — ROCURONIUM BROMIDE 10 MG/ML (PF) SYRINGE
PREFILLED_SYRINGE | INTRAVENOUS | Status: AC
  Filled 2017-09-28: qty 5

## 2017-09-28 MED ORDER — LIDOCAINE 2% (20 MG/ML) 5 ML SYRINGE
INTRAMUSCULAR | Status: AC
  Filled 2017-09-28: qty 5

## 2017-09-28 MED ORDER — CHLORHEXIDINE GLUCONATE 4 % EX LIQD
60.0000 mL | Freq: Once | CUTANEOUS | Status: DC
Start: 2017-09-29 — End: 2017-09-28
  Filled 2017-09-28: qty 118

## 2017-09-28 MED ORDER — EPHEDRINE SULFATE 50 MG/ML IJ SOLN
INTRAMUSCULAR | Status: DC | PRN
  Administered 2017-09-28: 5 mg via INTRAVENOUS

## 2017-09-28 MED ORDER — FENTANYL CITRATE (PF) 100 MCG/2ML IJ SOLN
25.0000 ug | INTRAMUSCULAR | Status: DC | PRN
  Administered 2017-09-28 (×3): 25 ug via INTRAVENOUS
  Administered 2017-09-28: 50 ug via INTRAVENOUS
  Filled 2017-09-28: qty 1

## 2017-09-28 MED ORDER — KETOROLAC TROMETHAMINE 30 MG/ML IJ SOLN
30.0000 mg | Freq: Once | INTRAMUSCULAR | Status: DC | PRN
  Filled 2017-09-28: qty 1

## 2017-09-28 MED ORDER — SODIUM CHLORIDE 0.9 % IV SOLN
INTRAVENOUS | Status: DC
  Administered 2017-09-28 (×2): via INTRAVENOUS
  Filled 2017-09-28: qty 1000

## 2017-09-28 MED ORDER — HYDROCODONE-ACETAMINOPHEN 5-325 MG PO TABS: 1.0000 | ORAL_TABLET | Freq: Four times a day (QID) | ORAL | 0 refills | Status: DC | PRN

## 2017-09-28 MED ORDER — FENTANYL CITRATE (PF) 100 MCG/2ML IJ SOLN
INTRAMUSCULAR | Status: DC | PRN
  Administered 2017-09-28: 100 ug via INTRAVENOUS

## 2017-09-28 MED ORDER — ONDANSETRON HCL 4 MG/2ML IJ SOLN
INTRAMUSCULAR | Status: AC
  Filled 2017-09-28: qty 2

## 2017-09-28 MED ORDER — MIDAZOLAM HCL 2 MG/2ML IJ SOLN
INTRAMUSCULAR | Status: AC
  Filled 2017-09-28: qty 2

## 2017-09-28 MED ORDER — ACETAMINOPHEN 500 MG PO TABS
1000.0000 mg | ORAL_TABLET | ORAL | Status: AC
  Administered 2017-09-28: 1000 mg via ORAL
  Filled 2017-09-28: qty 2

## 2017-09-28 MED ORDER — SUGAMMADEX SODIUM 200 MG/2ML IV SOLN
INTRAVENOUS | Status: DC | PRN
  Administered 2017-09-28: 200 mg via INTRAVENOUS

## 2017-09-28 MED ORDER — CHLORHEXIDINE GLUCONATE 4 % EX LIQD
60.0000 mL | Freq: Once | CUTANEOUS | Status: DC
  Filled 2017-09-28: qty 118

## 2017-09-28 MED ORDER — GABAPENTIN 300 MG PO CAPS
ORAL_CAPSULE | ORAL | Status: AC
  Filled 2017-09-28: qty 1

## 2017-09-28 SURGICAL SUPPLY — 55 items
ADH SKN CLS APL DERMABOND .7 (GAUZE/BANDAGES/DRESSINGS) ×2
BLADE HEX COATED 2.75 (ELECTRODE) ×3 IMPLANT
BLADE SURG 15 STRL LF DISP TIS (BLADE) ×2 IMPLANT
BLADE SURG 15 STRL SS (BLADE) ×3
BLADE SURG ROTATE 9660 (MISCELLANEOUS) IMPLANT
CANISTER SUCT 3000ML PPV (MISCELLANEOUS) ×1 IMPLANT
CELLS DAT CNTRL 66122 CELL SVR (MISCELLANEOUS) IMPLANT
CHLORAPREP W/TINT 26ML (MISCELLANEOUS) ×3 IMPLANT
COVER BACK TABLE 60X90IN (DRAPES) ×3 IMPLANT
COVER MAYO STAND STRL (DRAPES) ×3 IMPLANT
DECANTER SPIKE VIAL GLASS SM (MISCELLANEOUS) IMPLANT
DERMABOND ADVANCED (GAUZE/BANDAGES/DRESSINGS) ×1
DERMABOND ADVANCED .7 DNX12 (GAUZE/BANDAGES/DRESSINGS) ×2 IMPLANT
DRAPE LAPAROSCOPIC ABDOMINAL (DRAPES) ×3 IMPLANT
DRAPE UTILITY XL STRL (DRAPES) ×3 IMPLANT
DRSG TEGADERM 4X4.75 (GAUZE/BANDAGES/DRESSINGS) ×2 IMPLANT
ELECT REM PT RETURN 9FT ADLT (ELECTROSURGICAL) ×3
ELECTRODE REM PT RTRN 9FT ADLT (ELECTROSURGICAL) ×2 IMPLANT
GAUZE SPONGE 4X4 12PLY STRL (GAUZE/BANDAGES/DRESSINGS) ×1 IMPLANT
GAUZE SPONGE 4X4 12PLY STRL LF (GAUZE/BANDAGES/DRESSINGS) ×1 IMPLANT
GLOVE BIOGEL PI IND STRL 7.0 (GLOVE) ×2 IMPLANT
GLOVE BIOGEL PI INDICATOR 7.0 (GLOVE) ×1
GLOVE SURG SS PI 7.0 STRL IVOR (GLOVE) ×3 IMPLANT
GOWN STRL REUS W/ TWL LRG LVL3 (GOWN DISPOSABLE) ×4 IMPLANT
GOWN STRL REUS W/TWL LRG LVL3 (GOWN DISPOSABLE) ×6
KIT TURNOVER CYSTO (KITS) ×3 IMPLANT
MESH VENTRALEX ST 2.5 CRC MED (Mesh General) ×1 IMPLANT
NDL HYPO 25X1 1.5 SAFETY (NEEDLE) ×2 IMPLANT
NEEDLE HYPO 25X1 1.5 SAFETY (NEEDLE) ×3 IMPLANT
NS IRRIG 500ML POUR BTL (IV SOLUTION) ×3 IMPLANT
PACK BASIN DAY SURGERY FS (CUSTOM PROCEDURE TRAY) ×3 IMPLANT
PENCIL BUTTON HOLSTER BLD 10FT (ELECTRODE) ×3 IMPLANT
RETRACTOR WND ALEXIS 18 MED (MISCELLANEOUS) IMPLANT
RTRCTR WOUND ALEXIS 18CM MED (MISCELLANEOUS)
RTRCTR WOUND ALEXIS 18CM SML (INSTRUMENTS)
SAVER CELL AAL HAEMONETICS (INSTRUMENTS) IMPLANT
SPONGE LAP 4X18 X RAY DECT (DISPOSABLE) ×2 IMPLANT
STRIP CLOSURE SKIN 1/2X4 (GAUZE/BANDAGES/DRESSINGS) ×1 IMPLANT
SUT MNCRL AB 4-0 PS2 18 (SUTURE) ×3 IMPLANT
SUT NOVA 0 T19/GS 22DT (SUTURE) ×1 IMPLANT
SUT NOVA NAB GS-21 0 18 T12 DT (SUTURE) ×1 IMPLANT
SUT PDS AB 0 CT1 36 (SUTURE) ×1 IMPLANT
SUT PROLENE 0 CT 1 CR/8 (SUTURE) ×3 IMPLANT
SUT VIC AB 0 SH 27 (SUTURE) IMPLANT
SUT VIC AB 2-0 SH 27 (SUTURE) ×3
SUT VIC AB 2-0 SH 27X BRD (SUTURE) ×2 IMPLANT
SUT VIC AB 3-0 SH 27 (SUTURE) ×6
SUT VIC AB 3-0 SH 27X BRD (SUTURE) ×2 IMPLANT
SUT VIC AB 3-0 SH 27XBRD (SUTURE) IMPLANT
SYR BULB IRRIGATION 50ML (SYRINGE) ×3 IMPLANT
SYR CONTROL 10ML LL (SYRINGE) ×3 IMPLANT
TAPE STRIPS DRAPE STRL (GAUZE/BANDAGES/DRESSINGS) ×1 IMPLANT
TOWEL OR 17X24 6PK STRL BLUE (TOWEL DISPOSABLE) ×6 IMPLANT
TUBE CONNECTING 12X1/4 (SUCTIONS) ×3 IMPLANT
YANKAUER SUCT BULB TIP NO VENT (SUCTIONS) ×3 IMPLANT

## 2017-09-28 NOTE — Anesthesia Procedure Notes (Signed)
Procedure Name: Intubation Date/Time: 09/28/2017 7:30 AM Performed by: Lieutenant Diego, CRNA Pre-anesthesia Checklist: Patient identified, Emergency Drugs available, Suction available and Patient being monitored Patient Re-evaluated:Patient Re-evaluated prior to induction Oxygen Delivery Method: Circle system utilized Preoxygenation: Pre-oxygenation with 100% oxygen Induction Type: IV induction Ventilation: Mask ventilation without difficulty Laryngoscope Size: Miller and 2 Grade View: Grade I Tube type: Oral Tube size: 7.5 mm Number of attempts: 1 Airway Equipment and Method: Stylet and Oral airway Placement Confirmation: ETT inserted through vocal cords under direct vision,  positive ETCO2 and breath sounds checked- equal and bilateral Secured at: 22 cm Tube secured with: Tape Dental Injury: Teeth and Oropharynx as per pre-operative assessment

## 2017-09-28 NOTE — H&P (Signed)
Troy Luna is an 47 y.o. male.   Chief Complaint: hernia HPI: 47 yo male underwent laparoscopic appendectomy for acute appendicitis. Post op he was noted to have a reaction to the glue and now has developed a hernia at the extraction site. The hernia causes slight discomfort. He has no nausea or vomiting.  Past Medical History:  Diagnosis Date  . Benign positional vertigo 07/2017   resolved now, took pt for  . Chronic kidney disease    left kidney function at 20 percent, lov dr Junious Silk 07-29-17 on chart  . Hypertension   . Incisional hernia     Past Surgical History:  Procedure Laterality Date  . CYSTOSCOPY  1998   LEFT  . KIDNEY SURGERY Left 1998   pyeloplasty, open  . LAPAROSCOPIC APPENDECTOMY N/A 07/04/2017   Procedure: APPENDECTOMY LAPAROSCOPIC;  Surgeon: Kinsinger, Arta Bruce, MD;  Location: [facility];  Service: General;  Laterality: N/A;    History reviewed. No pertinent family history. Social History:  reports that he has never smoked. He has never used smokeless tobacco. He reports that he drinks alcohol. He reports that he does not use drugs.  Allergies:  Allergies  Allergen Reactions  . Other     Glue used to close incision after appendectomy caused  severe redness and possible allergic reaction    Medications Prior to Admission  Medication Sig Dispense Refill  . lisinopril (PRINIVIL,ZESTRIL) 10 MG tablet Take 10 mg by mouth at bedtime.       Results for orders placed or performed during the hospital encounter of 09/28/17 (from the past 48 hour(s))  I-STAT 4, (NA,K, GLUC, HGB,HCT)     Status: None   Collection Time: 09/28/17  7:11 AM  Result Value Ref Range   Sodium 142 135 - 145 mmol/L   Potassium 3.6 3.5 - 5.1 mmol/L   Glucose, Bld 76 65 - 99 mg/dL   HCT 42.0 39.0 - 52.0 %   Hemoglobin 14.3 13.0 - 17.0 g/dL   No results found.  Review of Systems  Constitutional: Negative for chills and fever.  HENT: Negative for hearing loss.   Eyes: Negative for  blurred vision and double vision.  Respiratory: Negative for cough and hemoptysis.   Cardiovascular: Negative for chest pain and palpitations.  Gastrointestinal: Negative for abdominal pain, nausea and vomiting.  Genitourinary: Negative for dysuria and urgency.  Musculoskeletal: Negative for myalgias and neck pain.  Skin: Negative for itching and rash.  Neurological: Negative for dizziness, tingling and headaches.  Endo/Heme/Allergies: Does not bruise/bleed easily.  Psychiatric/Behavioral: Negative for depression and suicidal ideas.    Blood pressure 118/73, pulse (!) 53, temperature 98.7 F (37.1 C), temperature source Oral, resp. rate 16, height 5\' 9"  (1.753 m), weight 86.1 kg (189 lb 12.8 oz), SpO2 98 %. Physical Exam  Vitals reviewed. Constitutional: He is oriented to person, place, and time. He appears well-developed and well-nourished.  HENT:  Head: Normocephalic and atraumatic.  Eyes: Pupils are equal, round, and reactive to light. Conjunctivae and EOM are normal.  Neck: Normal range of motion. Neck supple.  Cardiovascular: Normal rate and regular rhythm.  Respiratory: Effort normal and breath sounds normal.  GI: Soft. Bowel sounds are normal. He exhibits no distension. There is no tenderness.  Periumbilical scar with hernia  Musculoskeletal: Normal range of motion.  Neurological: He is alert and oriented to person, place, and time.  Skin: Skin is warm and dry.  Psychiatric: He has a normal mood and affect. His behavior is  normal.     Assessment/Plan 47 yo male with incisional hernia -open incisional hernia repair with mesh -planned outpatient procedure  Mickeal Skinner, MD 09/28/2017, 7:15 AM

## 2017-09-28 NOTE — Op Note (Signed)
PATIENT:  Troy Luna  47 y.o. male  PRE-OPERATIVE DIAGNOSIS:  INCISIONAL HERNIA  POST-OPERATIVE DIAGNOSIS:  INCISIONAL HERNIA  PROCEDURE:  Procedure(s): OPEN INCISIONAL HERNIA REPAIR INSERTION OF MESH  SURGEON:  Surgeon(s): Kinsinger, Arta Bruce, MD  ASSISTANT: none   ANESTHESIA:   local and general  Indications for procedure: Troy Luna is a 47 y.o. year old male with symptoms of periumbilical swelling a few months after laparoscopic appendectomy. Imaging and evaluation confirmed incisional hernia and after discussions the plan was made to proceed with open incisional hernia repair.  Description of procedure: The patient was brought into the operative suite. Anesthesia was administered with General endotracheal anesthesia. WHO checklist was applied. The patient was then placed in supine. The area was prepped and draped in the usual sterile fashion.  Next the skin to the left of the umbilicus was anesthetized with Exparel:Marcaine. The previous incision was reopened. A partially edematous amount of fat was identified. On further dissection this was part of the omentum and was freed from the fascia in 360 degrees and reduced. The hernia defect was 3 cm in diameter. Due to the size of the hernia, a 6.4 cm ventralex mesh was placed as an underlay using 6 0 novafil to ensure adequate coverage in all directions. The fascial defect was then primarily closed with interrupted 0 prolene sutures. The umbilical skin was sutured to the fascia with a 3-0 vicryl. The deep dermal space was closed with a 3-0 vicryl. 50 ml Exparel:Marcaine was injected into the muscle layer and around the fascia. The skin was closed with a 4-0 monocryl subcuticular suture. Dermabond was put in place for dressing. The patient awoke from anesthesia and was brought to pacu in stable condition. All counts were correct.  Findings: 3cm incisional hernia  Specimen: none  Blood loss: 50 ml  Local anesthesia: 50 ml  Exparel: Marcaine mix  Complications: none  PLAN OF CARE: Discharge to home after PACU  PATIENT DISPOSITION:  PACU - hemodynamically stable.  Gurney Maxin, M.D. General, Bariatric, & Minimally Invasive Surgery [facility], [place]  09/28/2017 8:35 AM

## 2017-09-28 NOTE — Discharge Instructions (Signed)

## 2017-09-28 NOTE — Transfer of Care (Signed)
Immediate Anesthesia Transfer of Care Note  Patient: Troy Luna  Procedure(s) Performed: OPEN INCISIONAL HERNIA REPAIR WITH MESH (N/A ) INSERTION OF MESH  Patient Location: PACU  Anesthesia Type:General  Level of Consciousness: sedated  Airway & Oxygen Therapy: Patient Spontanous Breathing and Patient connected to face mask oxygen  Post-op Assessment: Report given to RN and Post -op Vital signs reviewed and stable  Post vital signs: Reviewed and stable  Last Vitals:  Vitals Value Taken Time  BP 117/78 09/28/2017  8:45 AM  Temp    Pulse 65 09/28/2017  8:46 AM  Resp 14 09/28/2017  8:46 AM  SpO2 100 % 09/28/2017  8:46 AM  Vitals shown include unvalidated device data.  Last Pain:  Vitals:   09/28/17 0624  TempSrc:   PainSc: 0-No pain         Complications: No apparent anesthesia complications

## 2017-09-29 ENCOUNTER — Encounter (HOSPITAL_BASED_OUTPATIENT_CLINIC_OR_DEPARTMENT_OTHER): Payer: Self-pay | Admitting: General Surgery

## 2017-09-29 NOTE — Anesthesia Postprocedure Evaluation (Signed)
Anesthesia Post Note  Patient: Troy Luna  Procedure(s) Performed: OPEN INCISIONAL HERNIA REPAIR WITH MESH (N/A ) INSERTION OF MESH     Patient location during evaluation: PACU Anesthesia Type: General Level of consciousness: sedated and patient cooperative Pain management: pain level controlled Vital Signs Assessment: post-procedure vital signs reviewed and stable Respiratory status: spontaneous breathing Cardiovascular status: stable Anesthetic complications: no    Last Vitals:  Vitals:   09/28/17 1030 09/28/17 1144  BP: 115/72 124/72  Pulse: 60 (!) 57  Resp: 10 16  Temp:  36.6 C  SpO2: 96% 98%    Last Pain:  Vitals:   09/28/17 1144  TempSrc:   PainSc: 5    Pain Goal:                 Nolon Nations

## 2017-10-08 ENCOUNTER — Encounter: Payer: Self-pay | Admitting: Physical Therapy

## 2017-10-14 SURGERY — Surgical Case
Anesthesia: *Unknown

## 2017-10-18 ENCOUNTER — Ambulatory Visit: Payer: BLUE CROSS/BLUE SHIELD | Admitting: Physical Therapy

## 2017-10-21 ENCOUNTER — Other Ambulatory Visit: Payer: Self-pay | Admitting: Family Medicine

## 2017-10-21 DIAGNOSIS — R911 Solitary pulmonary nodule: Secondary | ICD-10-CM

## 2017-10-22 ENCOUNTER — Ambulatory Visit
Admission: RE | Admit: 2017-10-22 | Discharge: 2017-10-22 | Disposition: A | Discharge status: Home / Self Care | Payer: BLUE CROSS/BLUE SHIELD | Source: Ambulatory Visit | Attending: Family Medicine | Admitting: Family Medicine

## 2017-10-22 DIAGNOSIS — R911 Solitary pulmonary nodule: Secondary | ICD-10-CM | POA: Diagnosis not present

## 2017-11-05 ENCOUNTER — Ambulatory Visit: Payer: BLUE CROSS/BLUE SHIELD | Attending: Family Medicine | Admitting: Physical Therapy

## 2017-11-05 DIAGNOSIS — R42 Dizziness and giddiness: Secondary | ICD-10-CM | POA: Insufficient documentation

## 2017-11-05 NOTE — Therapy (Signed)
[facility] [address] [place], [place], [address] Phone: [phone]   Fax:  [phone]  Patient Details  Name: Troy Luna MRN: [id number] Date of Birth: August 22, 1970 Referring Provider:  Darcus Austin, MD  Encounter Date: 11/05/2017   Patient arrived and reported he has had no further episodes of vertigo, but wanted to come and "touch base." Educated patient that his current POC is valid for 90 days (so through 6/1) and if he has vertigo between now and then he can call here directly to get an appt. If after 6/1 he will need a new referral from his MD.   He asked about intermittent tingling he has felt in his face and feet mostly, but sometimes his hands. Always both sides. Can last up to an hour. Instructed him to contact his physician to discuss. He said he wonders if it could be a side effect of his medicines.     Rexanne Mano, PT 11/05/2017, 2:10 PM  [facility] [address] [place], [place], [address] Phone: [phone]   Fax:  [phone]

## 2017-12-08 DIAGNOSIS — R202 Paresthesia of skin: Secondary | ICD-10-CM | POA: Diagnosis not present

## 2017-12-08 DIAGNOSIS — I10 Essential (primary) hypertension: Secondary | ICD-10-CM | POA: Diagnosis not present

## 2017-12-16 DIAGNOSIS — L820 Inflamed seborrheic keratosis: Secondary | ICD-10-CM | POA: Diagnosis not present

## 2017-12-16 DIAGNOSIS — L821 Other seborrheic keratosis: Secondary | ICD-10-CM | POA: Diagnosis not present

## 2017-12-16 DIAGNOSIS — D485 Neoplasm of uncertain behavior of skin: Secondary | ICD-10-CM | POA: Diagnosis not present

## 2018-02-17 ENCOUNTER — Encounter: Payer: Self-pay | Admitting: Physical Therapy

## 2018-02-17 NOTE — Therapy (Signed)
[facility] [address] [place], [place], [address] Phone: [phone]   Fax:  [phone]  Patient Details  Name: Troy Luna MRN: [id number] Date of Birth: June 17, 1971 Referring Provider:  No ref. provider found  Encounter Date: 02/17/2018  PHYSICAL THERAPY DISCHARGE SUMMARY  Visits from Start of Care: 2  Current functional level related to goals / functional outcomes: Unable to evaluate as pt did not return to PT   Remaining deficits: Unable to evaluate as pt did not return to PT   Education / Equipment: Unable to evaluate as pt did not return to PT  Plan: Patient agrees to discharge.  Patient goals were not met. Patient is being discharged due to not returning since the last visit.  ?????      Rexanne Mano, PT 02/17/2018, 8:38 AM  [facility] [address] [place], [place], [address] Phone: [phone]   Fax:  [phone]

## 2018-03-04 DIAGNOSIS — G629 Polyneuropathy, unspecified: Secondary | ICD-10-CM | POA: Diagnosis not present

## 2018-03-14 ENCOUNTER — Ambulatory Visit: Payer: BLUE CROSS/BLUE SHIELD | Admitting: Neurology

## 2018-03-14 ENCOUNTER — Telehealth: Payer: Self-pay | Admitting: Neurology

## 2018-03-14 ENCOUNTER — Encounter: Payer: Self-pay | Admitting: Neurology

## 2018-03-14 VITALS — BP 110/80 | HR 71 | Ht 69.0 in | Wt 192.5 lb

## 2018-03-14 DIAGNOSIS — R202 Paresthesia of skin: Secondary | ICD-10-CM

## 2018-03-14 NOTE — Telephone Encounter (Signed)
MR Brain w/wo contrast & MR Cervical spine w/wo contrast Dr. Stephanie Acre Auth: [id number] (exp. 03/14/18 to 04/12/18). Patient is scheduled for 9//11/19 at [facility].

## 2018-03-14 NOTE — Progress Notes (Signed)
Reason for visit: Paresthesias  Referring physician: Dr. Sundra Aland is a 47 y.o. male  History of present illness:  Mr. Lorenson is a 47 year old right-handed white male with a history of paresthesias that began in the feet in January 2019.  The patient had gone for an appendectomy and shortly thereafter noted that he had high blood pressure.  The patient was placed on lisinopril around that time.  The patient began noting some tingling sensations of the feet up to the knees and then shortly thereafter involving the hands as well up to the elbows.  Within the last month, he has had facial numbness as well, left greater than right.  The patient has had a sensation of swelling in the neck without perceived swelling.  He has not had any problems with dizziness, visual disturbances, or troubles with speech or swallowing.  He has not had any falls.  He denies neck pain or low back pain.  The episodes of numbness may come and go, the episodes are daily however.  They are more noticeable at night when he tries to rest.  The patient has undergone blood work that included a B12 level of 330.  He is sent to this office for an evaluation.  Past Medical History:  Diagnosis Date  . Benign positional vertigo 07/2017   resolved now, took pt for  . Chronic kidney disease    left kidney function at 20 percent, lov dr Junious Silk 07-29-17 on chart  . Hypertension   . Incisional hernia     Past Surgical History:  Procedure Laterality Date  . APPENDECTOMY  06/2017  . CYSTOSCOPY  1998   LEFT  . INCISIONAL HERNIA REPAIR N/A 09/28/2017   Procedure: OPEN INCISIONAL HERNIA REPAIR WITH MESH;  Surgeon: Kinsinger, Arta Bruce, MD;  Location: [facility];  Service: General;  Laterality: N/A;  . INSERTION OF MESH  09/28/2017   Procedure: INSERTION OF MESH;  Surgeon: Kieth Brightly Arta Bruce, MD;  Location: [facility];  Service: General;;  . KIDNEY SURGERY Left 1998   pyeloplasty, open  . LAPAROSCOPIC APPENDECTOMY N/A 07/04/2017   Procedure: APPENDECTOMY LAPAROSCOPIC;  Surgeon: Kinsinger, Arta Bruce, MD;  Location: [facility];  Service: General;  Laterality: N/A;    History reviewed. No pertinent family history.  Social history:  reports that he has never smoked. He has never used smokeless tobacco. He reports that he drinks alcohol. He reports that he does not use drugs.  Medications:  Prior to Admission medications   Medication Sig Start Date End Date Taking? Authorizing Provider  lisinopril (PRINIVIL,ZESTRIL) 10 MG tablet Take 10 mg by mouth at bedtime.    Yes [provider]      Allergies  Allergen Reactions  . Amlodipine Besylate     Debilitating headaches.  . Hydrocodone-Acetaminophen Swelling    Facial swelling  . Other     Glue used to close incision after appendectomy caused  severe redness and possible allergic reaction    ROS:  Out of a complete 14 system review of symptoms, the patient complains only of the following symptoms, and all other reviewed systems are negative.  Blurred vision Numbness Snoring  Blood pressure 110/80, pulse 71, height 5\' 9"  (1.753 m), weight 192 lb 8 oz (87.3 kg), SpO2 98 %.  Physical Exam  General: The patient is alert and cooperative at the time of the examination.  Eyes: Pupils are equal, round, and reactive to light. Discs are flat  bilaterally.  Neck: The neck is supple, no carotid bruits are noted.  Respiratory: The respiratory examination is clear.  Cardiovascular: The cardiovascular examination reveals a regular rate and rhythm, no obvious murmurs or rubs are noted.  Neuromuscular: Range of movement the cervical spine is full.  Skin: Extremities are without significant edema.  Neurologic Exam  Mental status: The patient is alert and oriented x 3 at the time of the examination. The patient has apparent normal recent and remote memory, with an apparently normal attention span and  concentration ability.  Cranial nerves: Facial symmetry is present. There is good sensation of the face to pinprick and soft touch bilaterally. The strength of the facial muscles and the muscles to head turning and shoulder shrug are normal bilaterally. Speech is well enunciated, no aphasia or dysarthria is noted. Extraocular movements are full. Visual fields are full. The tongue is midline, and the patient has symmetric elevation of the soft palate. No obvious hearing deficits are noted.  Motor: The motor testing reveals 5 over 5 strength of all 4 extremities. Good symmetric motor tone is noted throughout.  Sensory: Sensory testing is intact to pinprick, soft touch, vibration sensation, and position sense on all 4 extremities. No evidence of extinction is noted.  Coordination: Cerebellar testing reveals good finger-nose-finger and heel-to-shin bilaterally.  Gait and station: Gait is normal. Tandem gait is normal. Romberg is negative. No drift is seen.  Reflexes: Deep tendon reflexes are symmetric and normal bilaterally.  The ankle jerk reflexes are well-maintained bilaterally, reflexes in the arms are relatively depressed compared to the legs.  Toes are downgoing bilaterally.   Assessment/Plan:  1.  Paresthesias, all 4 extremities including face  The clinical examination today is unremarkable.  The patient has subjective paresthesias that are daily in nature but will come and go.  The patient will be set up for blood work today, he will have MRI of the brain and cervical spine to exclude demyelinating disease.  We will contact him with the results of the above evaluations.  Clinical evaluation history go against a peripheral neuropathy.  Jill Alexanders MD 03/14/2018 9:57 AM  [facility] [address] [place], [place] [address]  Phone [phone] Fax [phone]

## 2018-03-16 ENCOUNTER — Ambulatory Visit: Payer: BLUE CROSS/BLUE SHIELD

## 2018-03-16 DIAGNOSIS — R202 Paresthesia of skin: Secondary | ICD-10-CM | POA: Diagnosis not present

## 2018-03-16 LAB — ANA W/REFLEX: Anti Nuclear Antibody(ANA): NEGATIVE

## 2018-03-16 LAB — COPPER, SERUM: COPPER: 104 ug/dL (ref 72–166)

## 2018-03-16 LAB — RHEUMATOID FACTOR: Rhuematoid fact SerPl-aCnc: 12.4 IU/mL (ref 0.0–13.9)

## 2018-03-16 LAB — B. BURGDORFI ANTIBODIES: Lyme IgG/IgM Ab: <0.91 {ISR} (ref 0.00–0.90)

## 2018-03-16 LAB — RPR: RPR: NONREACTIVE

## 2018-03-16 LAB — HIV ANTIBODY (ROUTINE TESTING W REFLEX): HIV Screen 4th Generation wRfx: NONREACTIVE

## 2018-03-16 LAB — SEDIMENTATION RATE: SED RATE: 2 mm/h (ref 0–15)

## 2018-03-16 LAB — ANGIOTENSIN CONVERTING ENZYME: ANGIO CONVERT ENZYME: 20 U/L (ref 14–82)

## 2018-03-16 MED ORDER — GADOPENTETATE DIMEGLUMINE 469.01 MG/ML IV SOLN
15.0000 mL | Freq: Once | INTRAVENOUS | Status: AC | PRN
  Administered 2018-03-16: 15 mL via INTRAVENOUS

## 2018-03-18 ENCOUNTER — Telehealth: Payer: Self-pay | Admitting: Neurology

## 2018-03-18 NOTE — Telephone Encounter (Signed)
  I called the patient.  MRI of the brain and cervical spine are completely normal, blood work is completely normal.  Clinical examination was completely normal.  The patient has well-maintained reflexes, sensory symptoms are intermittent, not persistent.  Clinical examination and history of involvement of face, arms, and legs is not consistent with a peripheral nerve issue.  I have indicated that the patient is to contact our office if there are changes in his clinical condition or more persistence of symptoms, we will pursue EMG and nerve conduction study at that time.   MRI cervical 03/17/18:  IMPRESSION:   Unremarkable MRI cervical spine (with and without). No spinal stenosis or foraminal narrowing. No spinal cord lesions.    MRI brain 03/17/18:  IMPRESSION:   Normal MRI brain (with and without).

## 2018-04-07 NOTE — Telephone Encounter (Signed)
Pt called stating that after going over the MRI and give it a little bit of time, still having neck pain/pressure. Would like a call to discuss further and if he should come in

## 2018-04-07 NOTE — Telephone Encounter (Signed)
I called pt back. He states he is having worsening neck pain/pressure. I relayed message from Dr. Jannifer Franklin that he suggested next steps would be EMG/NCS. I offered to speak with [facility] to see if they are willing to review/order since Dr. Jannifer Franklin out until next Tuesday. Pt declined. He would prefer to wait until Dr. Jannifer Franklin back in the office to get recommendations from him. He also c/o rapid heart rate, pulsating in neck intermittent. He states there are cardiac issues in the family , wondering if he should be worked up further for this. I suggested he could contact his PCP about this and get referral to cardiology if needed. He declined. Wants to wait to hear from Dr. Jannifer Franklin.  Pt also going out of the country 04/21/18. He wanted to make sure he was ok prior to going on trip.

## 2018-04-08 DIAGNOSIS — R0789 Other chest pain: Secondary | ICD-10-CM | POA: Diagnosis not present

## 2018-04-08 DIAGNOSIS — M542 Cervicalgia: Secondary | ICD-10-CM | POA: Diagnosis not present

## 2018-04-08 NOTE — Telephone Encounter (Signed)
I called the patient.  The patient is having episodes of sensation of swelling of the left neck that may last a full day, he may have some episodes of increased heart rate with this.  The episodes could represent anxiety episodes but getting a full cardiac evaluation is probably indicated.  The patient is seeing his primary care physician who has referred him to a cardiologist.  If the cardiac work-up is unremarkable, he will call our office and we may pursue EMG nerve conduction study at that time.

## 2018-04-14 DIAGNOSIS — Z23 Encounter for immunization: Secondary | ICD-10-CM | POA: Diagnosis not present

## 2018-05-02 NOTE — Progress Notes (Signed)
Cardiology Office Note   Date:  05/03/2018   ID:  Troy Luna, DOB 16-Nov-1970, MRN [id number]  PCP:  Darcus Austin, MD  Cardiologist:   No primary care provider on file. Referring:  Darcus Austin, MD  Chief Complaint  Patient presents with  . Neck Pain      History of Present Illness: Troy Luna is a 47 y.o. male who is referred by Dr. Inda Merlin for evaluation of neck discomfort.  He is also had palpitations.  The patient had a bit of a rough time recently with a ruptured appendix requiring surgery.  He was noted to be hypertensive at the time of that treatment and was started on lisinopril.  This was in late December.  He subsequently had to have laparoscopic repair of an incisional hernia.  Following all of this he developed numbness and paresthesias in his face.  He was seen by neurology and I reviewed these records.  He had a negative MRI of his neck and his brain.  The etiology was not entirely clear.  He started noticing some neck fullness.  It was his left neck.  It would happen sporadically but might notice it when he was lying down.  He might feel fullness with a pulsation in his neck.  He feels his heart rate increase and noted on his watch it it is increasing 200 though he could not record her rhythm strip.  The neck fullness would come sporadically but might last all day.  He could bring it on with activity.  He was not describing chest pressure.  He was not having new shortness of breath, PND or orthopnea.  He was not describing particularly irregular beats and was not having any presyncope or syncope.  Is not been having any weight gain or edema.  Of note prior to this he was routinely running in training for marathons.  He is not been able to run more than 4 5 miles recently.  He is put on weight because of this.  He is more fatigued.  Past Medical History:  Diagnosis Date  . Benign positional vertigo 07/2017   resolved now, took pt for  . Chronic kidney disease     left kidney function at 20 percent, lov dr Junious Silk 07-29-17 on chart  . Hypertension   . Incisional hernia     Past Surgical History:  Procedure Laterality Date  . APPENDECTOMY  06/2017  . CYSTOSCOPY  1998   LEFT  . INCISIONAL HERNIA REPAIR N/A 09/28/2017   Procedure: OPEN INCISIONAL HERNIA REPAIR WITH MESH;  Surgeon: Kinsinger, Arta Bruce, MD;  Location: [facility];  Service: General;  Laterality: N/A;  . INSERTION OF MESH  09/28/2017   Procedure: INSERTION OF MESH;  Surgeon: Kieth Brightly Arta Bruce, MD;  Location: [facility];  Service: General;;  . KIDNEY SURGERY Left 1998   pyeloplasty, open  . LAPAROSCOPIC APPENDECTOMY N/A 07/04/2017   Procedure: APPENDECTOMY LAPAROSCOPIC;  Surgeon: Kinsinger, Arta Bruce, MD;  Location: [facility];  Service: General;  Laterality: N/A;     Current Outpatient Medications  Medication Sig Dispense Refill  . lisinopril (PRINIVIL,ZESTRIL) 10 MG tablet Take 10 mg by mouth at bedtime.      No current facility-administered medications for this visit.     Allergies:   Amlodipine besylate; Hydrocodone-acetaminophen; and Other    Social History:  The patient  reports that he has never smoked. He has never used smokeless tobacco. He reports that he  drinks alcohol. He reports that he does not use drugs.   Family History:  The patient's family history includes Heart disease in his brother; Heart disease (age of onset: 71) in his father; Obesity in his brother; Stroke in his mother.    ROS:  Please see the history of present illness.   Otherwise, review of systems are positive for fatigue, weight gain.   All other systems are reviewed and negative.    PHYSICAL EXAM: VS:  BP (!) 120/94   Pulse (!) 53   Ht 5\' 9"  (1.753 m)   Wt 194 lb 9.6 oz (88.3 kg)   BMI 28.74 kg/m  , BMI Body mass index is 28.74 kg/m. GENERAL:  Well appearing HEENT:  Pupils equal round and reactive, fundi not visualized, oral mucosa unremarkable NECK:  No  jugular venous distention, waveform within normal limits, carotid upstroke brisk and symmetric, no bruits, no thyromegaly LYMPHATICS:  No cervical, inguinal adenopathy LUNGS:  Clear to auscultation bilaterally BACK:  No CVA tenderness CHEST:  Unremarkable HEART:  PMI not displaced or sustained,S1 and S2 within normal limits, no S3, no S4, no clicks, no rubs, no murmurs ABD:  Flat, positive bowel sounds normal in frequency in pitch, no bruits, no rebound, no guarding, no midline pulsatile mass, no hepatomegaly, no splenomegaly EXT:  2 plus pulses throughout, no edema, no cyanosis no clubbing SKIN:  No rashes no nodules NEURO:  Cranial nerves II through XII grossly intact, motor grossly intact throughout PSYCH:  Cognitively intact, oriented to person place and time    EKG:  EKG is ordered today. The ekg ordered today demonstrates sinus rhythm, rate 53, axis within normal limits, intervals within normal limits, no acute ST-T wave changes.   Recent Labs: 07/04/2017: ALT 13 07/06/2017: Platelets 184 07/07/2017: BUN 11; Creatinine, Ser 1.48 09/28/2017: Hemoglobin 14.3; Potassium 3.6; Sodium 142    Lipid Panel No results found for: CHOL, TRIG, HDL, CHOLHDL, VLDL, LDLCALC, LDLDIRECT    Wt Readings from Last 3 Encounters:  05/03/18 194 lb 9.6 oz (88.3 kg)  03/14/18 192 lb 8 oz (87.3 kg)  09/28/17 189 lb 12.8 oz (86.1 kg)      Other studies Reviewed: Additional studies/ records that were reviewed today include: Labs, hospital records. Review of the above records demonstrates:  Please see elsewhere in the note.     ASSESSMENT AND PLAN:  NECK PAIN: He has some atypical neck pain but he has a very strong family history.  I would like to start with a coronary calcium score with a low threshold for POET (Plain Old Exercise Treadmill)  PALPITATIONS:  I have suggested an Alive Cor.  Of note he did have labs that included normal thyroid and electrolytes and I was able to review  these.  DYSLIPIDEMIA: I reviewed labs with him.  He has an LDL that is 134.  HDL is 51.  Total 208.  Given his strong family history I would want aggressive therapy.  This can further be guided by the above work-up and he and I will discuss this.  We did discuss plant-based/Mediterranean hybrid diet.   Current medicines are reviewed at length with the patient today.  The patient does not have concerns regarding medicines.  The following changes have been made:  no change  Labs/ tests ordered today include:   Orders Placed This Encounter  Procedures  . CT CARDIAC SCORING  . EKG 12-Lead     Disposition:   FU with me after the above study.  Signed, Minus Breeding, MD  05/03/2018 9:12 PM    [facility]

## 2018-05-03 ENCOUNTER — Encounter: Payer: Self-pay | Admitting: Cardiology

## 2018-05-03 ENCOUNTER — Ambulatory Visit (INDEPENDENT_AMBULATORY_CARE_PROVIDER_SITE_OTHER): Payer: BLUE CROSS/BLUE SHIELD | Admitting: Cardiology

## 2018-05-03 VITALS — BP 120/94 | HR 53 | Ht 69.0 in | Wt 194.6 lb

## 2018-05-03 DIAGNOSIS — M542 Cervicalgia: Secondary | ICD-10-CM | POA: Diagnosis not present

## 2018-05-03 DIAGNOSIS — R002 Palpitations: Secondary | ICD-10-CM

## 2018-05-03 NOTE — Patient Instructions (Addendum)
Medication Instructions:  Continue current medications  If you need a refill on your cardiac medications before your next appointment, please call your pharmacy.  Labwork: None Ordered   If you have labs (blood work) drawn today and your tests are completely normal, you will receive your results only by: Marland Kitchen MyChart Message (if you have MyChart) OR . A paper copy in the mail If you have any lab test that is abnormal or we need to change your treatment, we will call you to review the results.  Testing/Procedures: Your physician has requested that you have a Coronary Calcium Score. This test is done at our [facility] street office   Follow-Up: . You will need a follow up appointment in As Needed.    At [facility], you and your health needs are our priority.  As part of our continuing mission to provide you with exceptional heart care, we have created designated Provider Care Teams.  These Care Teams include your primary Cardiologist (physician) and Advanced Practice Providers (APPs -  Physician Assistants and Nurse Practitioners) who all work together to provide you with the care you need, when you need it.   Thank you for choosing [facility] at [facility]!!

## 2018-05-11 ENCOUNTER — Telehealth: Payer: Self-pay | Admitting: Cardiology

## 2018-05-11 NOTE — Telephone Encounter (Signed)
Returned call to patient.04/27/18 EKG normal sinus rhythm rate 53,normal axis,no acute ST-T wave changes.

## 2018-05-11 NOTE — Telephone Encounter (Signed)
New Message   Pt states he is calling with question on a result he received on my chart. Please call

## 2018-05-18 ENCOUNTER — Ambulatory Visit (INDEPENDENT_AMBULATORY_CARE_PROVIDER_SITE_OTHER)
Admission: RE | Admit: 2018-05-18 | Discharge: 2018-05-18 | Disposition: A | Discharge status: Home / Self Care | Payer: Self-pay | Source: Ambulatory Visit | Attending: Cardiology | Admitting: Cardiology

## 2018-05-18 DIAGNOSIS — R002 Palpitations: Secondary | ICD-10-CM

## 2018-05-18 DIAGNOSIS — M542 Cervicalgia: Secondary | ICD-10-CM

## 2018-05-20 ENCOUNTER — Telehealth: Payer: Self-pay | Admitting: Cardiology

## 2018-05-20 NOTE — Telephone Encounter (Signed)
Spoke to patient. Informed him report has not been reviewed - preliminary calcium scoring given  aware he will receive phone call of result

## 2018-05-20 NOTE — Telephone Encounter (Signed)
New Message   Patient is calling to obtain the results of his CT Cardiac Scoring. Please call to discuss.

## 2018-05-23 ENCOUNTER — Telehealth: Payer: Self-pay | Admitting: *Deleted

## 2018-05-23 DIAGNOSIS — R931 Abnormal findings on diagnostic imaging of heart and coronary circulation: Secondary | ICD-10-CM

## 2018-05-23 NOTE — Telephone Encounter (Signed)
-----   Message from Minus Breeding, MD sent at 05/21/2018 12:28 PM EST ----- Mild coronary calcium.  I would like to follow up with a POET (Plain Old Exercise Treadmill).  Please call him with results and schedule.  Call Mr. Hallam with the results and send results to Darcus Austin, MD

## 2018-05-23 NOTE — Telephone Encounter (Signed)
Pt aware of his coronary calcium, GXT ordered and send to schedule to be schedule

## 2018-05-24 ENCOUNTER — Encounter: Payer: Self-pay | Admitting: Cardiology

## 2018-05-27 ENCOUNTER — Telehealth (HOSPITAL_COMMUNITY): Payer: Self-pay

## 2018-05-27 NOTE — Telephone Encounter (Signed)
Encounter complete. 

## 2018-06-01 ENCOUNTER — Telehealth (HOSPITAL_COMMUNITY): Payer: Self-pay | Admitting: *Deleted

## 2018-06-01 ENCOUNTER — Ambulatory Visit (HOSPITAL_COMMUNITY)
Admission: RE | Admit: 2018-06-01 | Discharge: 2018-06-01 | Disposition: A | Discharge status: Home / Self Care | Payer: BLUE CROSS/BLUE SHIELD | Source: Ambulatory Visit | Attending: Cardiology | Admitting: Cardiology

## 2018-06-01 DIAGNOSIS — R931 Abnormal findings on diagnostic imaging of heart and coronary circulation: Secondary | ICD-10-CM

## 2018-06-01 LAB — EXERCISE TOLERANCE TEST
CHL CUP MPHR: 172 {beats}/min
CHL CUP RESTING HR STRESS: 51 {beats}/min
CSEPEW: 15.7 METS
CSEPPHR: 157 {beats}/min
Exercise duration (min): 13 min
Exercise duration (sec): 22 s
Percent HR: 91 %
RPE: 19

## 2018-06-16 DIAGNOSIS — R109 Unspecified abdominal pain: Secondary | ICD-10-CM | POA: Diagnosis not present

## 2018-06-16 NOTE — Telephone Encounter (Signed)
Close encounter 

## 2018-06-21 ENCOUNTER — Other Ambulatory Visit: Payer: Self-pay | Admitting: General Surgery

## 2018-06-21 DIAGNOSIS — R109 Unspecified abdominal pain: Secondary | ICD-10-CM

## 2018-06-27 ENCOUNTER — Ambulatory Visit
Admission: RE | Admit: 2018-06-27 | Discharge: 2018-06-27 | Disposition: A | Discharge status: Home / Self Care | Payer: BLUE CROSS/BLUE SHIELD | Source: Ambulatory Visit | Attending: General Surgery | Admitting: General Surgery

## 2018-06-27 DIAGNOSIS — N420 Calculus of prostate: Secondary | ICD-10-CM | POA: Diagnosis not present

## 2018-06-27 DIAGNOSIS — R109 Unspecified abdominal pain: Secondary | ICD-10-CM

## 2018-06-27 MED ORDER — IOPAMIDOL (ISOVUE-300) INJECTION 61%
100.0000 mL | Freq: Once | INTRAVENOUS | Status: AC | PRN
  Administered 2018-06-27: 100 mL via INTRAVENOUS

## 2018-07-15 DIAGNOSIS — R109 Unspecified abdominal pain: Secondary | ICD-10-CM | POA: Diagnosis not present

## 2018-08-03 ENCOUNTER — Encounter: Payer: Self-pay | Admitting: Physician Assistant

## 2018-08-10 ENCOUNTER — Encounter: Payer: Self-pay | Admitting: Physician Assistant

## 2018-08-10 ENCOUNTER — Ambulatory Visit (INDEPENDENT_AMBULATORY_CARE_PROVIDER_SITE_OTHER): Payer: BLUE CROSS/BLUE SHIELD | Admitting: Physician Assistant

## 2018-08-10 VITALS — BP 116/78 | HR 60 | Ht 68.5 in | Wt 194.4 lb

## 2018-08-10 DIAGNOSIS — R1031 Right lower quadrant pain: Secondary | ICD-10-CM | POA: Diagnosis not present

## 2018-08-10 DIAGNOSIS — Z9049 Acquired absence of other specified parts of digestive tract: Secondary | ICD-10-CM

## 2018-08-10 DIAGNOSIS — G8929 Other chronic pain: Secondary | ICD-10-CM | POA: Diagnosis not present

## 2018-08-10 MED ORDER — SUPREP BOWEL PREP KIT 17.5-3.13-1.6 GM/177ML PO SOLN: 1.0000 | ORAL | 0 refills | Status: DC

## 2018-08-10 NOTE — Patient Instructions (Signed)
If you are age 49 or older, your body mass index should be between 23-30. Your Body mass index is 29.12 kg/m. If this is out of the aforementioned range listed, please consider follow up with your Primary Care Provider.  If you are age 29 or younger, your body mass index should be between 19-25. Your Body mass index is 29.12 kg/m. If this is out of the aformentioned range listed, please consider follow up with your Primary Care Provider.    You have been scheduled for a colonoscopy. Please follow written instructions given to you at your visit today.  Please pick up your prep supplies at the pharmacy within the next 1-3 days. If you use inhalers (even only as needed), please bring them with you on the day of your procedure. Your physician has requested that you go to www.startemmi.com and enter the access code given to you at your visit today. This web site gives a general overview about your procedure. However, you should still follow specific instructions given to you by our office regarding your preparation for the procedure.  Thank you for choosing me and [facility].  Troy Luna

## 2018-08-10 NOTE — Progress Notes (Addendum)
Chief Complaint: Abdominal pain  HPI:    Troy Luna is a 48 year old Caucasian male with a past medical history as listed below including appendectomy 06/2017 and hernia repair with mesh in the fall 2019, who presents clinic today as a referral from Dr. Inda Merlin, with a complaint of right lower quadrant abdominal pain.    06/27/2018 CT abdomen pelvis with contrast showed mild postoperative change in the right lower quadrant at the site of recent appendectomy, no abscess or abnormal fluid.  Postoperative change in the left kidney with mild chronic fullness in left renal collecting system without obstructing focus evident.  No evident bowel obstruction.  No abscess.  Pars defects L5 bilaterally without appreciable spondylolisthesis.    Today, patient presents clinic and explains that in 2018 he had emergency appendectomy and this year in December started feeling a similar right lower quadrant sharp pain which was worse when he laid on that side.  This was reminiscent of his pain before having an appendectomy.  He went to see his surgeon in follow-up and had CT as above.  They then sent him to our clinic for further eval.      Tells me that this pain has decreased in frequency and severity since January explaining that now it is maybe a 3-4/10 and occurs maybe once every other day and does not last longer than an hour.  This is not as stabbing as before.  Describes regular bowel movements are once every 2 to 3 days but this is normal for him.  Denies relation between days of no bowel movement and increased pain or specific foods that he is eating and increased pain.  Associated symptoms include some left lower quadrant pain occasionally as well as some pain to the right of his umbilicus.    Denies fever, chills, weight loss, nausea, vomiting, heartburn, reflux or blood in his stool.  Past Medical History:  Diagnosis Date  . Benign positional vertigo 07/2017   resolved now, took pt for  . Chronic kidney  disease    left kidney function at 20 percent, lov dr Junious Silk 07-29-17 on chart  . Hypertension   . Incisional hernia     Past Surgical History:  Procedure Laterality Date  . APPENDECTOMY  06/2017  . CYSTOSCOPY  1998   LEFT  . INCISIONAL HERNIA REPAIR N/A 09/28/2017   Procedure: OPEN INCISIONAL HERNIA REPAIR WITH MESH;  Surgeon: Kinsinger, Arta Bruce, MD;  Location: [facility];  Service: General;  Laterality: N/A;  . INSERTION OF MESH  09/28/2017   Procedure: INSERTION OF MESH;  Surgeon: Kieth Brightly Arta Bruce, MD;  Location: [facility];  Service: General;;  . KIDNEY SURGERY Left 1998   pyeloplasty, open  . LAPAROSCOPIC APPENDECTOMY N/A 07/04/2017   Procedure: APPENDECTOMY LAPAROSCOPIC;  Surgeon: Kinsinger, Arta Bruce, MD;  Location: [facility];  Service: General;  Laterality: N/A;    Current Outpatient Medications  Medication Sig Dispense Refill  . lisinopril (PRINIVIL,ZESTRIL) 10 MG tablet Take 10 mg by mouth at bedtime.      No current facility-administered medications for this visit.     Allergies as of 08/10/2018 - Review Complete 08/10/2018  Allergen Reaction Noted  . Hydrocodone-acetaminophen Swelling 03/14/2018  . Norvasc [amlodipine besylate]  03/14/2018  . Other  09/24/2017    Family History  Problem Relation Age of Onset  . Stroke Mother        Died age 71  . Heart disease Father 47  Died age 23, 3 "open hearts"   . Diabetes Father   . Obesity Brother   . Heart disease Brother        No details    Social History   Socioeconomic History  . Marital status: Married    Spouse name: Not on file  . Number of children: 2  . Years of education: BA  . Highest education level: Not on file  Occupational History  . Occupation: VP of Operations  Social Needs  . Financial resource strain: Not on file  . Food insecurity:    Worry: Not on file    Inability: Not on file  . Transportation needs:    Medical: Not on file     Non-medical: Not on file  Tobacco Use  . Smoking status: Never Smoker  . Smokeless tobacco: Never Used  Substance and Sexual Activity  . Alcohol use: Yes    Frequency: Never    Comment: few drinks per week  . Drug use: No  . Sexual activity: Not on file  Lifestyle  . Physical activity:    Days per week: Not on file    Minutes per session: Not on file  . Stress: Not on file  Relationships  . Social connections:    Talks on phone: Not on file    Gets together: Not on file    Attends religious service: Not on file    Active member of club or organization: Not on file    Attends meetings of clubs or organizations: Not on file    Relationship status: Not on file  . Intimate partner violence:    Fear of current or ex partner: Not on file    Emotionally abused: Not on file    Physically abused: Not on file    Forced sexual activity: Not on file  Other Topics Concern  . Not on file  Social History Narrative   Right handed    Caffeine use: no soda   Coffee/tea- 3-4 cups per days    Review of Systems:    Constitutional: No weight loss, fever or chills Skin: No rash  Cardiovascular: No chest pain  Respiratory: No SOB Gastrointestinal: See HPI and otherwise negative Genitourinary: No dysuria Neurological: No headache, dizziness or syncope Musculoskeletal: No new muscle or joint pain Hematologic: No bleeding Psychiatric: No history of depression or anxiety   Physical Exam:  Vital signs: BP 116/78 (BP Location: Left Arm, Patient Position: Sitting, Cuff Size: Normal)   Pulse 60   Ht 5' 8.5" (1.74 m) Comment: height measured without shoes  Wt 194 lb 6 oz (88.2 kg)   BMI 29.12 kg/m   Constitutional:   Pleasant Caucasian male appears to be in NAD, Well developed, Well nourished, alert and cooperative Head:  Normocephalic and atraumatic. Eyes:   PEERL, EOMI. No icterus. Conjunctiva pink. Ears:  Normal auditory acuity. Neck:  Supple Throat: Oral cavity and pharynx without  inflammation, swelling or lesion.  Respiratory: Respirations even and unlabored. Lungs clear to auscultation bilaterally.   No wheezes, crackles, or rhonchi.  Cardiovascular: Normal S1, S2. No MRG. Regular rate and rhythm. No peripheral edema, cyanosis or pallor.  Gastrointestinal:  Soft, nondistended,mild RLQ ttp, No rebound or guarding. Normal bowel sounds. No appreciable masses or hepatomegaly. Rectal:  Not performed.  Msk:  Symmetrical without gross deformities. Without edema, no deformity or joint abnormality.  Neurologic:  Alert and  oriented x4;  grossly normal neurologically.  Skin:   Dry and intact without  significant lesions or rashes. Psychiatric: Demonstrates good judgement and reason without abnormal affect or behaviors.  No recent labs. See HPI for imaging.  Assessment: 1.  Abdominal pain: Mostly in the right lower quadrant, over the past 2 months, discussed with patient that this could just be from scar tissue in this area given his recent appendectomy, patient would like further evaluation with a colonoscopy; consider inflammatory cause versus scar tissue versus IBS 2.  Appendectomy in 2018 as well as mesh repair of hernia in fall 2019  Plan: 1.  Scheduled patient for colonoscopy in the [place] with Dr. Hilarie Fredrickson.  Did discuss risks, benefits, limitations and alternatives and patient agrees to proceed. 2.  Patient to follow in clinic per recommendations from Dr. Hilarie Fredrickson after time of procedure.  Ellouise Newer, PA-C [facility] 08/10/2018, 2:30 PM  CC: Dr. Inda Merlin  Addendum: Reviewed and agree with assessment and management plan. Pyrtle, Lajuan Lines, MD

## 2018-08-11 ENCOUNTER — Encounter: Payer: Self-pay | Admitting: Internal Medicine

## 2018-08-16 ENCOUNTER — Encounter: Payer: Self-pay | Admitting: Internal Medicine

## 2018-08-16 ENCOUNTER — Ambulatory Visit (AMBULATORY_SURGERY_CENTER): Payer: BLUE CROSS/BLUE SHIELD | Admitting: Internal Medicine

## 2018-08-16 VITALS — BP 104/65 | HR 53 | Temp 95.3°F | Resp 11 | Ht 68.0 in | Wt 194.0 lb

## 2018-08-16 DIAGNOSIS — D123 Benign neoplasm of transverse colon: Secondary | ICD-10-CM

## 2018-08-16 DIAGNOSIS — R1031 Right lower quadrant pain: Secondary | ICD-10-CM | POA: Diagnosis not present

## 2018-08-16 MED ORDER — SODIUM CHLORIDE 0.9 % IV SOLN: 500.0000 mL | Freq: Once | INTRAVENOUS | Status: DC

## 2018-08-16 NOTE — Progress Notes (Signed)
Report to PACU, RN, vss, BBS= Clear.  

## 2018-08-16 NOTE — Patient Instructions (Signed)
Please read handouts provided. Continue present medications. Await pathology results.     YOU HAD AN ENDOSCOPIC PROCEDURE TODAY AT THE [facility]:   Refer to the procedure report that was given to you for any specific questions about what was found during the examination.  If the procedure report does not answer your questions, please call your gastroenterologist to clarify.  If you requested that your care partner not be given the details of your procedure findings, then the procedure report has been included in a sealed envelope for you to review at your convenience later.  YOU SHOULD EXPECT: Some feelings of bloating in the abdomen. Passage of more gas than usual.  Walking can help get rid of the air that was put into your GI tract during the procedure and reduce the bloating. If you had a lower endoscopy (such as a colonoscopy or flexible sigmoidoscopy) you may notice spotting of blood in your stool or on the toilet paper. If you underwent a bowel prep for your procedure, you may not have a normal bowel movement for a few days.  Please Note:  You might notice some irritation and congestion in your nose or some drainage.  This is from the oxygen used during your procedure.  There is no need for concern and it should clear up in a day or so.  SYMPTOMS TO REPORT IMMEDIATELY:   Following lower endoscopy (colonoscopy or flexible sigmoidoscopy):  Excessive amounts of blood in the stool  Significant tenderness or worsening of abdominal pains  Swelling of the abdomen that is new, acute  Fever of 100F or higher    For urgent or emergent issues, a gastroenterologist can be reached at any hour by calling [phone].   DIET:  We do recommend a small meal at first, but then you may proceed to your regular diet.  Drink plenty of fluids but you should avoid alcoholic beverages for 24 hours.  ACTIVITY:  You should plan to take it easy for the rest of today and you should NOT DRIVE  or use heavy machinery until tomorrow (because of the sedation medicines used during the test).    FOLLOW UP: Our staff will call the number listed on your records the next business day following your procedure to check on you and address any questions or concerns that you may have regarding the information given to you following your procedure. If we do not reach you, we will leave a message.  However, if you are feeling well and you are not experiencing any problems, there is no need to return our call.  We will assume that you have returned to your regular daily activities without incident.  If any biopsies were taken you will be contacted by phone or by letter within the next 1-3 weeks.  Please call us at [phone] if you have not heard about the biopsies in 3 weeks.    SIGNATURES/CONFIDENTIALITY: You and/or your care partner have signed paperwork which will be entered into your electronic medical record.  These signatures attest to the fact that that the information above on your After Visit Summary has been reviewed and is understood.  Full responsibility of the confidentiality of this discharge information lies with you and/or your care-partner. 

## 2018-08-16 NOTE — Op Note (Signed)
[facility] Patient Name: Troy Luna Procedure Date: 08/16/2018 10:10 AM MRN: [id number] Endoscopist: Jerene Bears , MD Age: 48 Referring MD:  Date of Birth: [birth year] Gender: Male Account #: [id number] Procedure:                Colonoscopy Indications:              Abdominal pain in the right lower quadrant Medicines:                Monitored Anesthesia Care Procedure:                Pre-Anesthesia Assessment:                           - Prior to the procedure, a History and Physical                            was performed, and patient medications and                            allergies were reviewed. The patient's tolerance of                            previous anesthesia was also reviewed. The risks                            and benefits of the procedure and the sedation                            options and risks were discussed with the patient.                            All questions were answered, and informed consent                            was obtained. Prior Anticoagulants: The patient has                            taken no previous anticoagulant or antiplatelet                            agents. ASA Grade Assessment: II - A patient with                            mild systemic disease. After reviewing the risks                            and benefits, the patient was deemed in                            satisfactory condition to undergo the procedure.                           After obtaining informed consent, the colonoscope  was passed under direct vision. Throughout the                            procedure, the patient's blood pressure, pulse, and                            oxygen saturations were monitored continuously. The                            Colonoscope was introduced through the anus and                            advanced to the terminal ileum. The colonoscopy was                            performed without  difficulty. The patient tolerated                            the procedure well. The quality of the bowel                            preparation was good. The terminal ileum, ileocecal                            valve, appendiceal orifice, and rectum were                            photographed. Scope In: 10:25:20 AM Scope Out: 10:38:55 AM Scope Withdrawal Time: 0 hours 10 minutes 25 seconds  Total Procedure Duration: 0 hours 13 minutes 35 seconds  Findings:                 The digital rectal exam was normal.                           The terminal ileum appeared normal.                           A 3 mm polyp was found in the transverse colon. The                            polyp was sessile. The polyp was removed with a                            cold snare. Resection and retrieval were complete.                           The exam was otherwise without abnormality on                            direct and retroflexion views. No evidence of                            colitis or inflammation. Complications:  No immediate complications. Estimated Blood Loss:     Estimated blood loss: none. Impression:               - The examined portion of the ileum was normal.                           - One 3 mm polyp in the transverse colon, removed                            with a cold snare. Resected and retrieved.                           - The examination was otherwise normal on direct                            and retroflexion views. Recommendation:           - Patient has a contact number available for                            emergencies. The signs and symptoms of potential                            delayed complications were discussed with the                            patient. Return to normal activities tomorrow.                            Written discharge instructions were provided to the                            patient.                           - Resume previous diet.                            - Continue present medications.                           - Await pathology results.                           - Repeat colonoscopy is recommended. The                            colonoscopy date will be determined after pathology                            results from today's exam become available for                            review. Jerene Bears, MD 08/16/2018 10:44:42 AM This report has been signed electronically.

## 2018-08-16 NOTE — Progress Notes (Signed)
Called to room to assist during endoscopic procedure.  Patient ID and intended procedure confirmed with present staff. Received instructions for my participation in the procedure from the performing physician.  

## 2018-08-16 NOTE — Progress Notes (Signed)
Pt's states no medical or surgical changes since previsit or office visit. 

## 2018-08-17 ENCOUNTER — Telehealth: Payer: Self-pay

## 2018-08-17 NOTE — Telephone Encounter (Signed)
  Follow up Call-  Call back number 08/16/2018  Post procedure Call Back phone  # [phone]  Permission to leave phone message Yes  Some recent data might be hidden     Patient questions:  Do you have a fever, pain , or abdominal swelling? No. Pain Score  0 *  Have you tolerated food without any problems? Yes.    Have you been able to return to your normal activities? Yes.    Do you have any questions about your discharge instructions: Diet   No. Medications  No. Follow up visit  No.  Do you have questions or concerns about your Care? No.  Actions: * If pain score is 4 or above: No action needed, pain <4.

## 2018-08-19 ENCOUNTER — Encounter: Payer: Self-pay | Admitting: Internal Medicine

## 2018-10-05 DIAGNOSIS — I10 Essential (primary) hypertension: Secondary | ICD-10-CM | POA: Diagnosis not present

## 2019-02-10 IMAGING — CT CT ABD-PELV W/O CM
1 of 2 series · 13 of 32 positions shown, 18 images · non-contrast
Comparison: Ultrasound 07/06/2017, CT 07/04/2017

CLINICAL DATA: Status post laparoscopic appendectomy 07/04/2017
with drainage and swelling from umbilical incision

EXAM:
CT ABDOMEN AND PELVIS WITHOUT CONTRAST
TECHNIQUE: Multidetector CT imaging of the abdomen and pelvis was performed
following the standard protocol without IV contrast.

[Series 2: abd/pelvis w/(date) · axial · 0.73mm/px · z∈[-468,+7]mm · 13 of 107 slices shown, 18 images]
[im 6/107  soft-tissue]
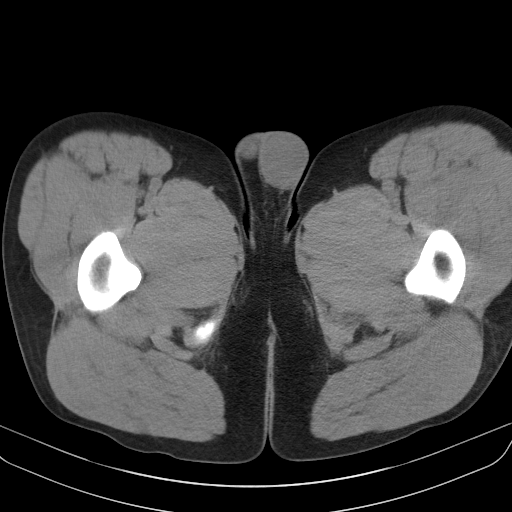
[im 6/107  bone]
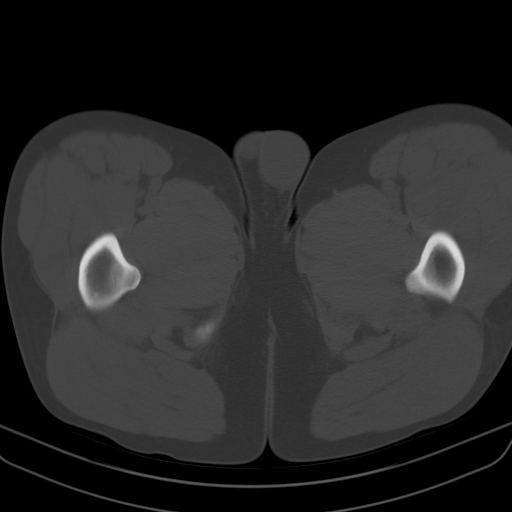
[im 17/107  soft-tissue]
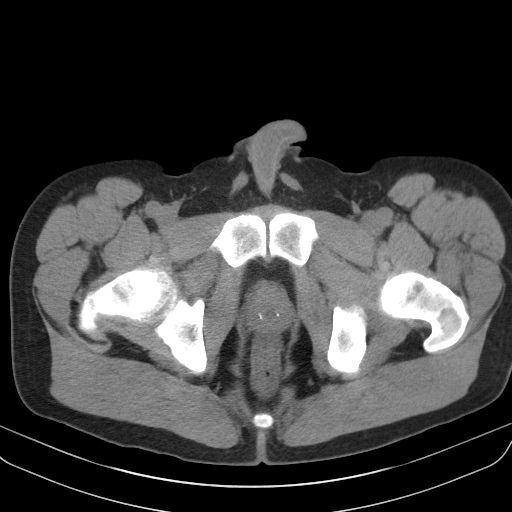
[im 23/107  soft-tissue]
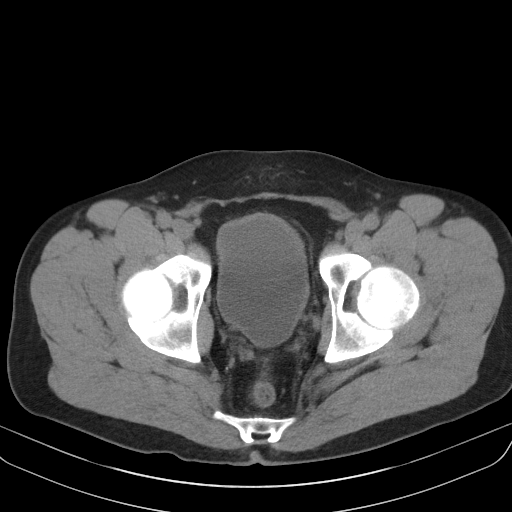
[im 34/107  soft-tissue]
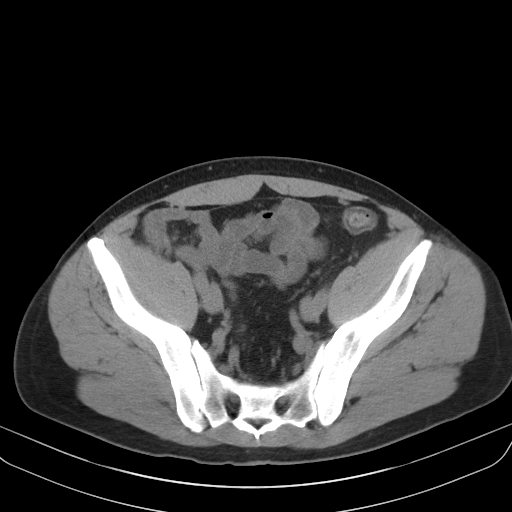
[im 40/107  soft-tissue]
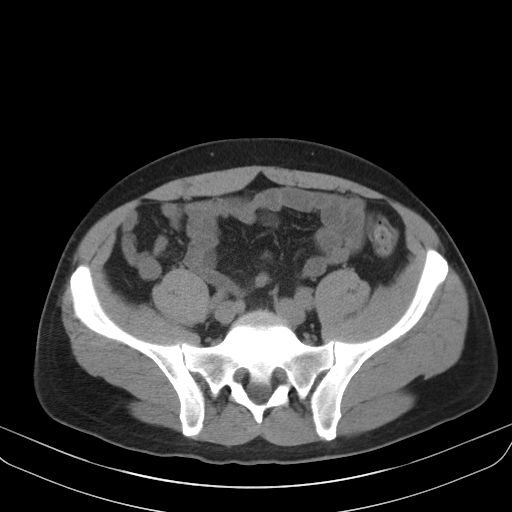
[im 51/107  soft-tissue]
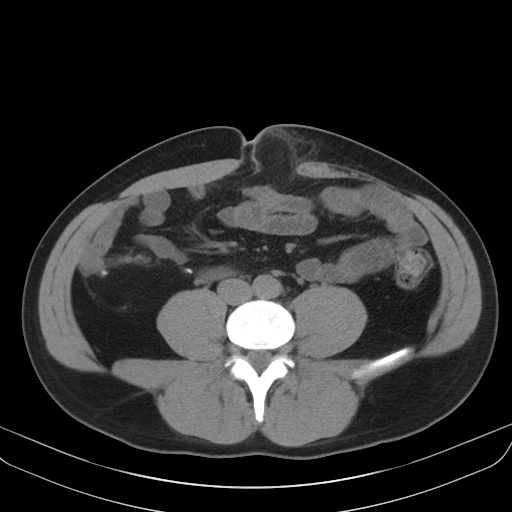
[im 56/107  soft-tissue]
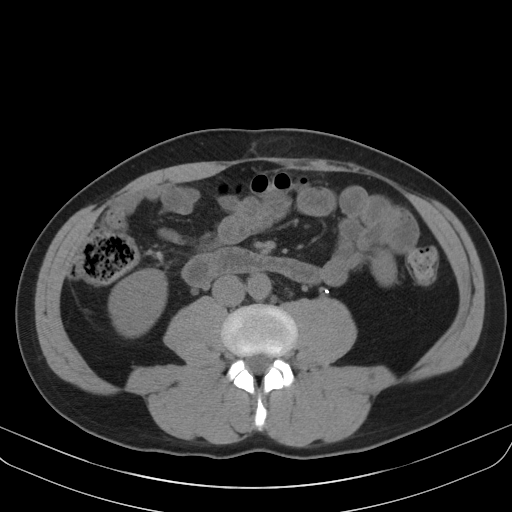
[im 67/107  soft-tissue]
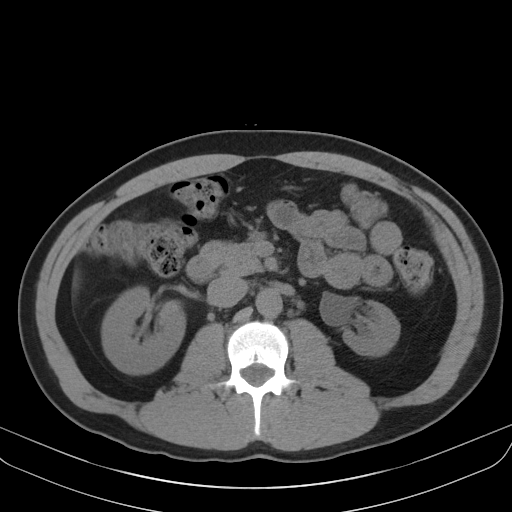
[im 73/107  soft-tissue]
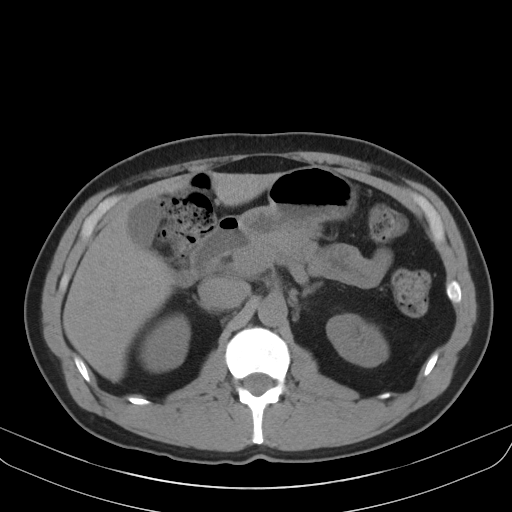
[im 73/107  bone]
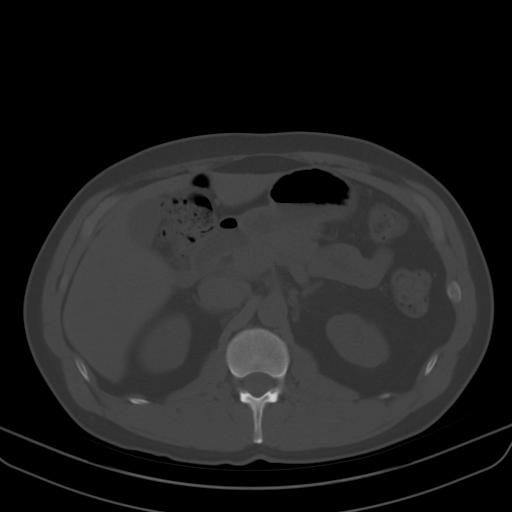
[im 84/107  soft-tissue]
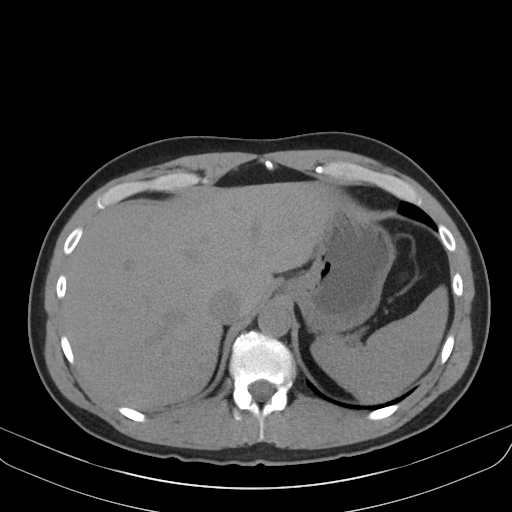
[im 84/107  lung]
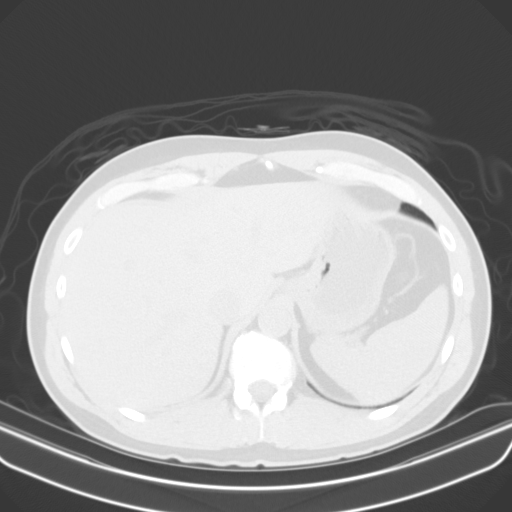
[im 90/107  soft-tissue]
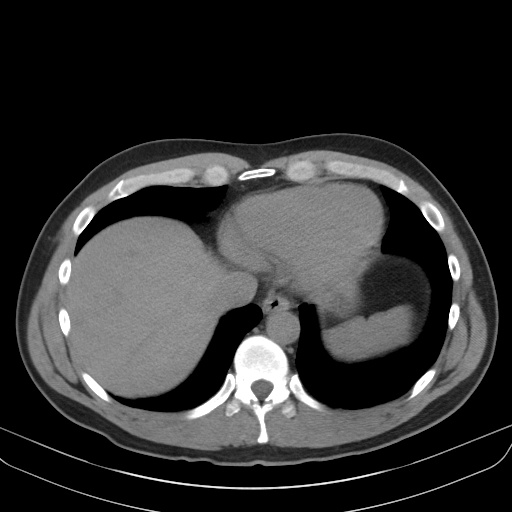
[im 90/107  lung]
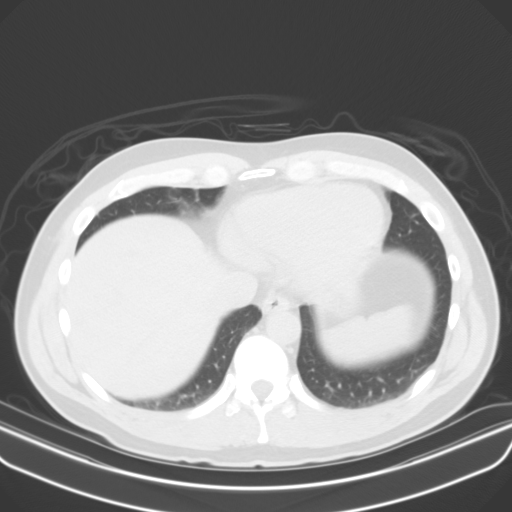
[im 95/107  lung]
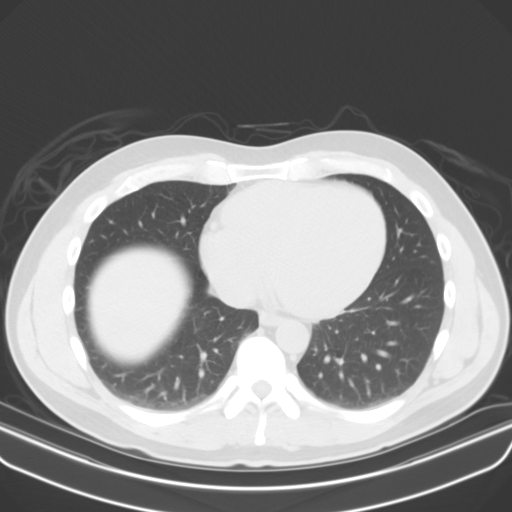
[im 101/107  soft-tissue]
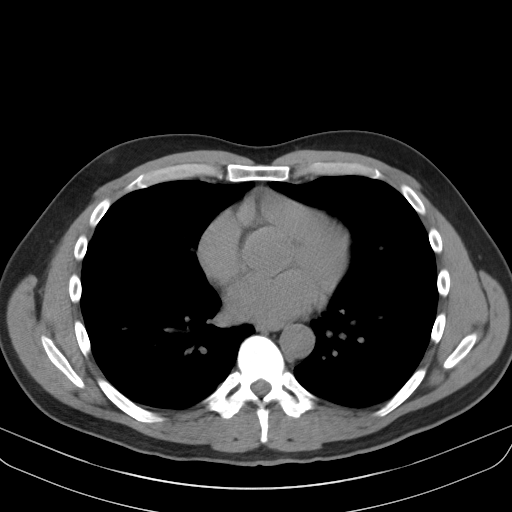
[im 101/107  lung]
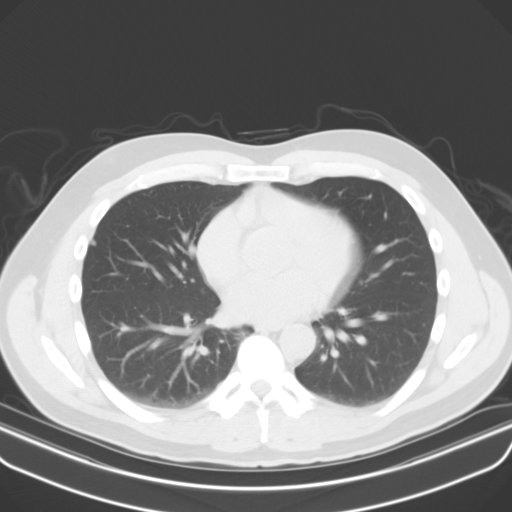

[13 of 32 positions shown; findings below may reference images not displayed]

FINDINGS: Lower chest: Lung bases demonstrate no consolidation or effusion.
Subpleural 4 mm pulmonary nodule in the right middle lobe, series 5,
image number 15. Normal heart size. Mild gynecomastia.

Hepatobiliary: No focal liver abnormality is seen. No gallstones,
gallbladder wall thickening, or biliary dilatation.

Pancreas: Unremarkable. No pancreatic ductal dilatation or
surrounding inflammatory changes.

Spleen: Normal in size without focal abnormality.

Adrenals/Urinary Tract: Adrenal glands are within normal limits.
Atrophic left kidney with stable hydronephrosis. Bladder
unremarkable

Stomach/Bowel: Stomach nonenlarged. No dilated small bowel. Interval
appendectomy. No focal fluid collections in the right lower
quadrant. Colon diverticula without acute inflammation

Vascular/Lymphatic: Nonaneurysmal aorta. Nonspecific subcentimeter
retroperitoneal lymph nodes

Reproductive: Prostate is unremarkable.

Other: Negative for free air or free fluid. Interim finding of left
periumbilical hernia containing mesenteric fat. Transverse defect
measuring 3 cm. Minimal haziness within the herniated fat. No fluid
collections. Tiny pre-existing fat containing umbilical hernia.

Musculoskeletal: No acute or suspicious lesion. Chronic bilateral
pars defect at L5.
IMPRESSION: 1. Interim finding of left periumbilical ventral hernia containing
mesenteric fat but no bowel. Mild haziness/edema within the
herniated fat but no focal fluid collection to suggest abscess.
2. Interval appendectomy
3. Subpleural 4 mm pulmonary nodule in the right middle lobe. No
follow-up needed if patient is low-risk. Non-contrast chest CT can
be considered in 12 months if patient is high-risk. This
recommendation follows the consensus statement: Guidelines for
Management of Incidental Pulmonary Nodules Detected on CT Images:
4. Atrophic left kidney with stable hydronephrosis
5. Chronic bilateral pars defect at L5

## 2019-03-14 IMAGING — CT CT CHEST W/O CM
2 of 4 series · 11 of 36 positions shown, 13 images · non-contrast
Comparison: CT abdomen 09/20/2017

CLINICAL DATA: 46-year-old male with a history of lung nodule on
prior CT 319

EXAM:
CT CHEST WITHOUT CONTRAST
TECHNIQUE: Multidetector CT imaging of the chest was performed following the
standard protocol without IV contrast.

[Series 2: chest 2.00 br40 s3 ax · axial · 0.53mm/px · z∈[+1387,+1677]mm · 8 of 171 slices shown, 10 images]
[im 13/171  mediastinal]
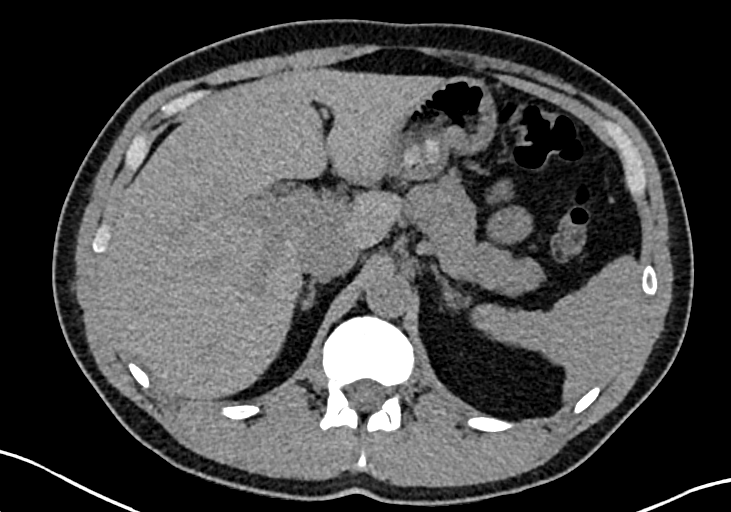
[im 13/171  lung]
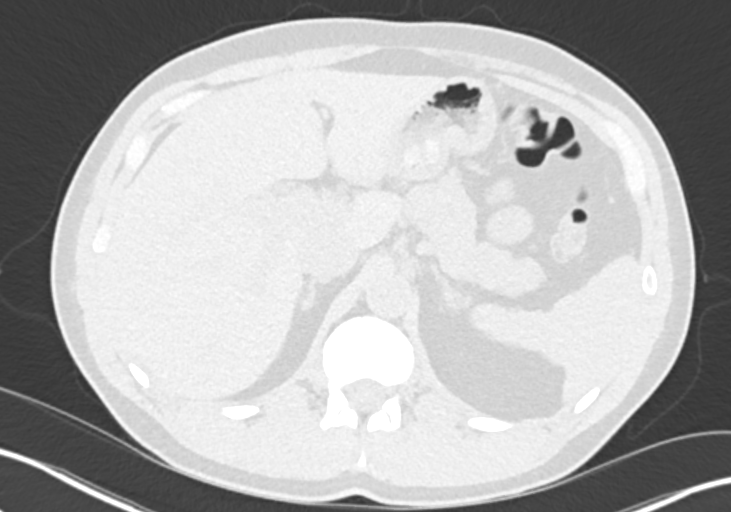
[im 37/171  lung]
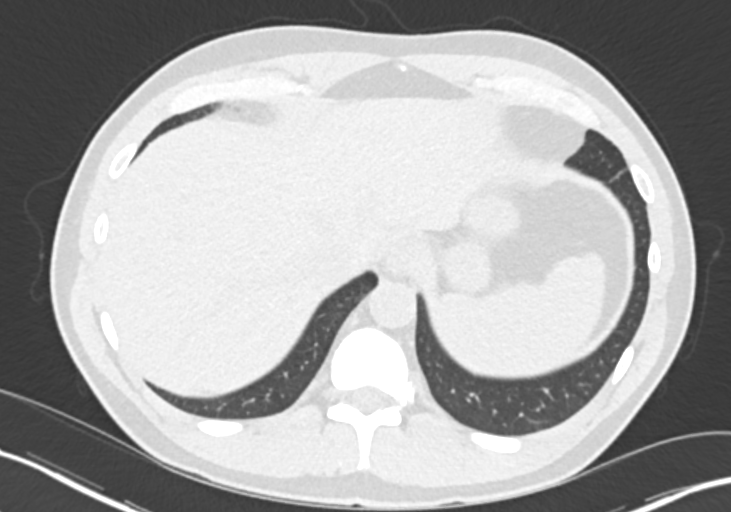
[im 61/171  lung]
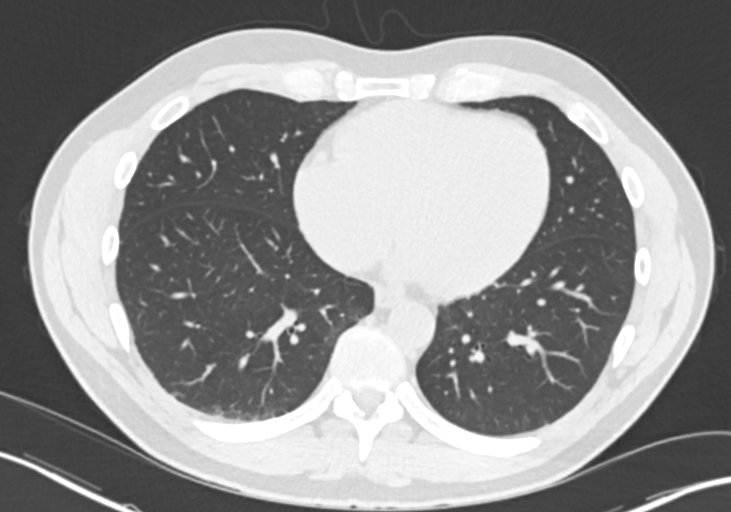
[im 73/171  lung]
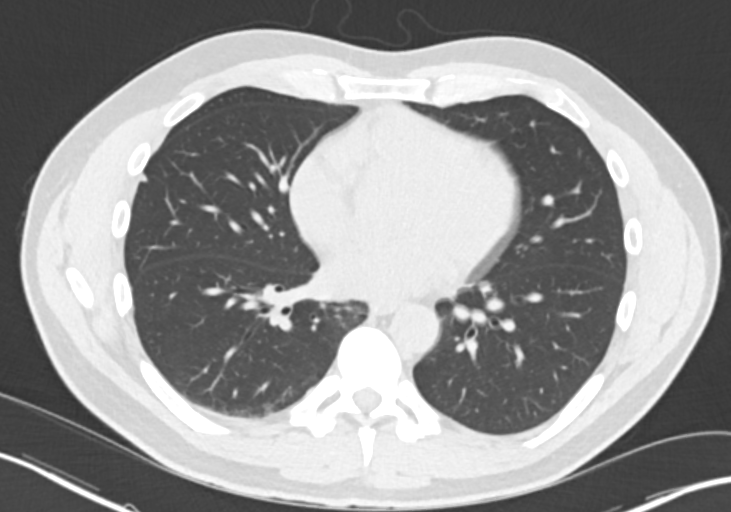
[im 98/171  mediastinal]
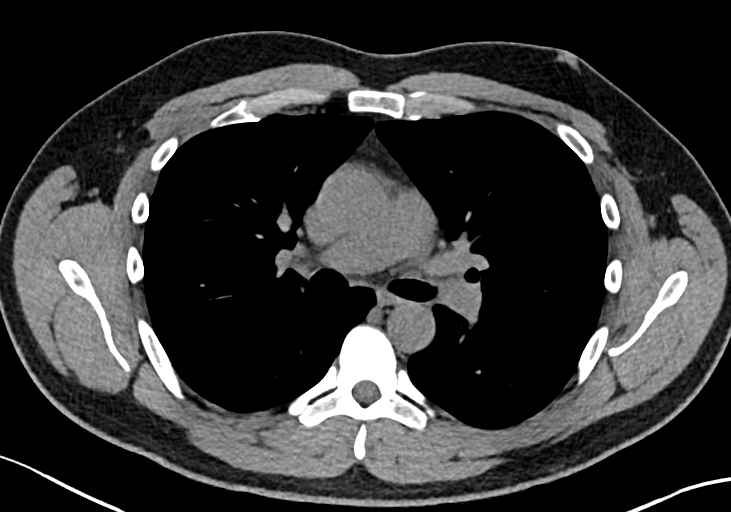
[im 98/171  lung]
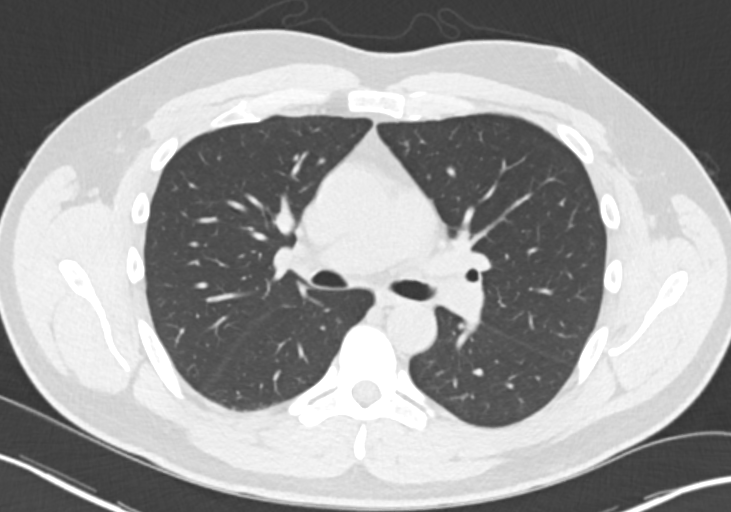
[im 110/171  lung]
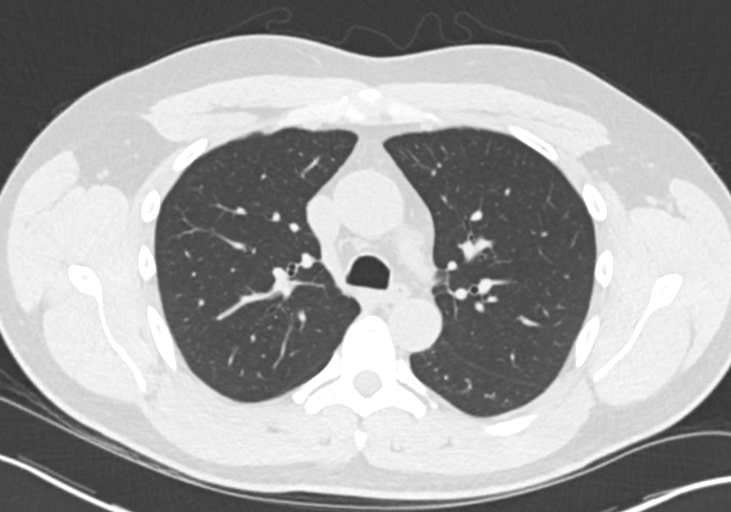
[im 134/171  lung]
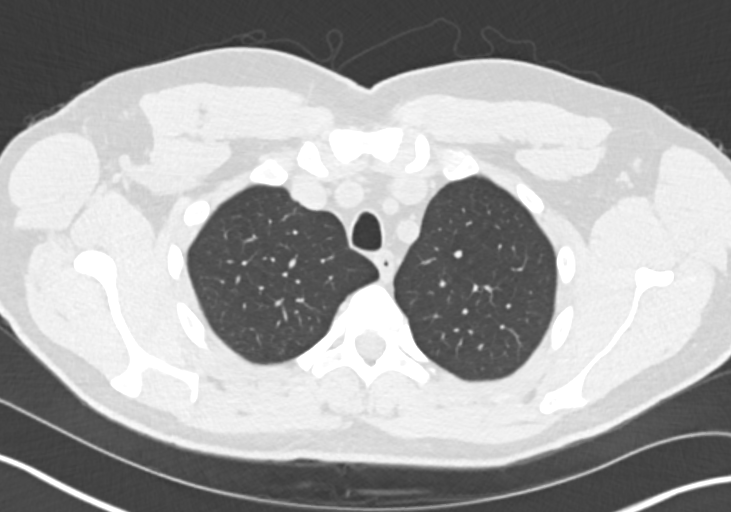
[im 158/171  lung]
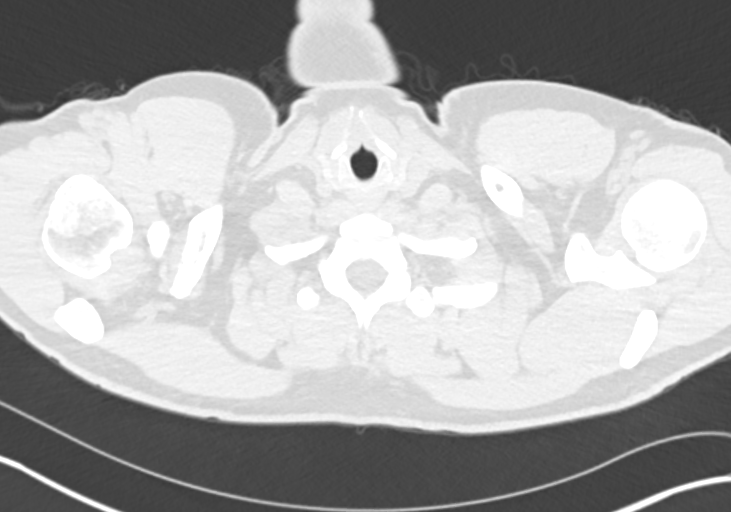

[Series 4: chest 2.00 br40 s3 cor · coronal · 0.67mm/px · 3 of 135 slices shown]
[im 27/135  lung]
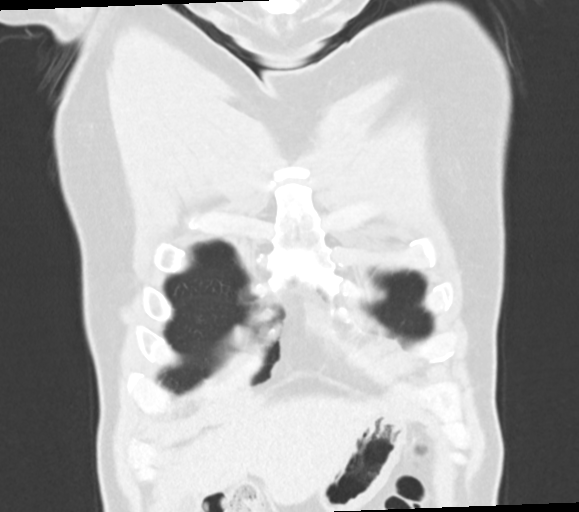
[im 54/135  lung]
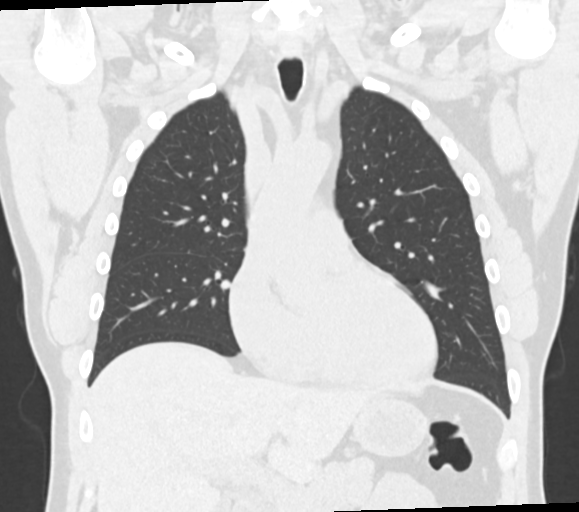
[im 81/135  lung]
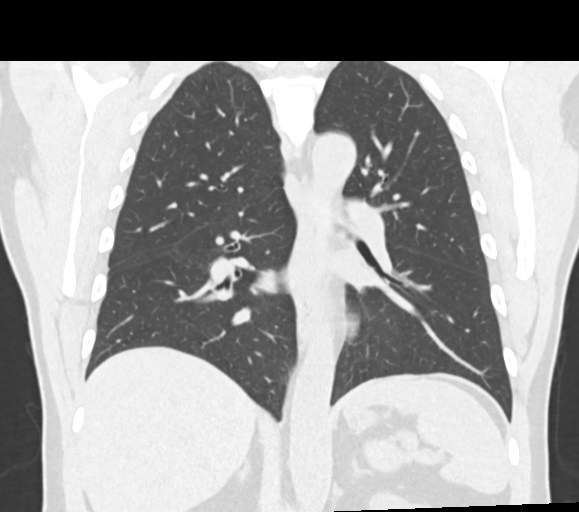

[11 of 36 positions shown; findings below may reference images not displayed]

FINDINGS: Cardiovascular: No significant vascular findings. Normal heart size.
No pericardial effusion.

Mediastinum/Nodes: No enlarged mediastinal or axillary lymph nodes.
Thyroid gland, trachea, and esophagus demonstrate no significant
findings.

Lungs/Pleura: Lungs are clear. No pleural effusion or pneumothorax.
Redemonstration of right sided pleural based 4 mm nodule (image 99
of series 8). Minimal atelectasis

Upper Abdomen: No acute abnormality.

Musculoskeletal: No acute displaced fracture. No significant
degenerative changes.
IMPRESSION: No acute CT finding.

Redemonstration of right-sided subpleural 4 mm nodule. No follow-up
needed if the patient is low risk. A noncontrast chest CT may be
considered in 12 months if the patient is high risk. These
recommendations follow the updated [HOSPITAL] guidelines.

## 2019-05-02 DIAGNOSIS — L918 Other hypertrophic disorders of the skin: Secondary | ICD-10-CM | POA: Diagnosis not present

## 2019-05-16 DIAGNOSIS — F5221 Male erectile disorder: Secondary | ICD-10-CM | POA: Diagnosis not present

## 2019-05-16 DIAGNOSIS — Z0000 Encounter for general adult medical examination without abnormal findings: Secondary | ICD-10-CM | POA: Diagnosis not present

## 2019-05-16 DIAGNOSIS — I10 Essential (primary) hypertension: Secondary | ICD-10-CM | POA: Diagnosis not present

## 2019-05-16 DIAGNOSIS — E7800 Pure hypercholesterolemia, unspecified: Secondary | ICD-10-CM | POA: Diagnosis not present

## 2019-07-16 DIAGNOSIS — Z20822 Contact with and (suspected) exposure to covid-19: Secondary | ICD-10-CM | POA: Diagnosis not present

## 2019-09-25 ENCOUNTER — Encounter: Payer: Self-pay | Admitting: Adult Health

## 2019-09-25 ENCOUNTER — Telehealth (INDEPENDENT_AMBULATORY_CARE_PROVIDER_SITE_OTHER): Payer: Self-pay | Admitting: Adult Health

## 2019-09-25 ENCOUNTER — Telehealth: Payer: Self-pay | Admitting: Cardiology

## 2019-09-25 VITALS — Ht 69.0 in | Wt 185.0 lb

## 2019-09-25 DIAGNOSIS — M542 Cervicalgia: Secondary | ICD-10-CM

## 2019-09-25 DIAGNOSIS — Z8249 Family history of ischemic heart disease and other diseases of the circulatory system: Secondary | ICD-10-CM

## 2019-09-25 DIAGNOSIS — I10 Essential (primary) hypertension: Secondary | ICD-10-CM

## 2019-09-25 DIAGNOSIS — R0789 Other chest pain: Secondary | ICD-10-CM

## 2019-09-25 DIAGNOSIS — E785 Hyperlipidemia, unspecified: Secondary | ICD-10-CM

## 2019-09-25 NOTE — Telephone Encounter (Signed)
Pt called and states he is having some pain in his upper back that is radiating down his left arm. The pain is more localized to his back and neck, but he contacted Cardiology because of the pain moving down his arm. He thinks it may be muscle strain, but called just to be sure. He is also going to contact Orthopedics.  He is a patient of Dr. Percival Spanish but has not been seen since October 2019. He states he has a family hx of heart disease.   He has a virtual visit scheduled for today to speak with Jory Sims

## 2019-09-25 NOTE — Progress Notes (Signed)
Virtual Visit via Video Note   This visit type was conducted due to national recommendations for restrictions regarding the COVID-19 Pandemic (e.g. social distancing) in an effort to limit this patient's exposure and mitigate transmission in our community.  Due to his co-morbid illnesses, this patient is at least at moderate risk for complications without adequate follow up.  This format is felt to be most appropriate for this patient at this time.  All issues noted in this document were discussed and addressed.  A limited physical exam was performed with this format.  Please refer to the patient's chart for his consent to telehealth for [facility].   Date:  09/25/2019   ID:  Troy Luna, DOB 11-Feb-1971, MRN [id number]  Patient Location: Home Provider Location: Home  PCP:  Darcus Austin, MD (Inactive)  Cardiologist: DR. Percival Spanish   Electrophysiologist:  None   Evaluation Performed:  Follow-Up Visit  Chief Complaint:  Neck Pain  History of Present Illness:    Troy Luna is a 49 y.o. male with who presents today after not being seen since 2019 via virtual visit with complaints of left shoulder pain, left-sided neck pain, posterior neck pain, and left arm pain which has been constant for 1 week.  Pain is worsened with movement.  Due to his history of premature coronary artery disease (father had heart attack at age 8) he became concerned that the symptoms were related to cardiac etiology.  As stated he was last seen by Dr. Percival Spanish on 05/03/2018.  Dr. Percival Spanish ordered a stress test, and recommended aggressive therapy for hyperlipidemia.   The patient is a runner and often participates in marathons.  He states he continues to do this without any complaints of chest pain, dyspnea on exertion, excessive fatigue, or dizziness.  Per notes, the patient had "excellent exercise tolerance" with no ST-T wave abnormalities.  Found to be a negative stress test.  The patient does not  have symptoms concerning for COVID-19 infection (fever, chills, cough, or new shortness of breath).    Past Medical History:  Diagnosis Date  . Benign positional vertigo 07/2017   resolved now, took pt for  . Chronic kidney disease    left kidney function at 20 percent, lov dr Junious Silk 07-29-17 on chart  . Hypertension   . Incisional hernia    Past Surgical History:  Procedure Laterality Date  . APPENDECTOMY  06/2017  . CYSTOSCOPY  1998   LEFT  . INCISIONAL HERNIA REPAIR N/A 09/28/2017   Procedure: OPEN INCISIONAL HERNIA REPAIR WITH MESH;  Surgeon: Kinsinger, Arta Bruce, MD;  Location: [facility];  Service: General;  Laterality: N/A;  . INSERTION OF MESH  09/28/2017   Procedure: INSERTION OF MESH;  Surgeon: Kieth Brightly Arta Bruce, MD;  Location: [facility];  Service: General;;  . KIDNEY SURGERY Left 1998   pyeloplasty, open  . LAPAROSCOPIC APPENDECTOMY N/A 07/04/2017   Procedure: APPENDECTOMY LAPAROSCOPIC;  Surgeon: Kinsinger, Arta Bruce, MD;  Location: [facility];  Service: General;  Laterality: N/A;     Current Meds  Medication Sig  . lisinopril (PRINIVIL,ZESTRIL) 10 MG tablet Take 10 mg by mouth at bedtime.      Allergies:   Hydrocodone-acetaminophen, Norvasc [amlodipine besylate], and Other   Social History   Tobacco Use  . Smoking status: Never Smoker  . Smokeless tobacco: Never Used  Substance Use Topics  . Alcohol use: Yes    Comment: few drinks per week  . Drug use: No  Family Hx: The patient's family history includes Diabetes in his father; Heart disease in his brother; Heart disease (age of onset: 1) in his father; Obesity in his brother; Stroke in his mother.  ROS:   Please see the history of present illness.    All other systems reviewed and are negative.   Prior CV studies:   The following studies were reviewed today: Stress test as above  Labs/Other Tests and Data Reviewed:    EKG:  No ECG reviewed.  Recent Labs:  No results found for requested labs within last 8760 hours.   Recent Lipid Panel No results found for: CHOL, TRIG, HDL, CHOLHDL, LDLCALC, LDLDIRECT  Wt Readings from Last 3 Encounters:  09/25/19 185 lb (83.9 kg)  08/16/18 194 lb (88 kg)  08/10/18 194 lb 6 oz (88.2 kg)     Objective:    Vital Signs:  Ht 5\' 9"  (1.753 m)   Wt 185 lb (83.9 kg)   BMI 27.32 kg/m    VITAL SIGNS:  reviewed NEURO:  alert and oriented x 3, no obvious focal deficit PSYCH:  normal affect  ASSESSMENT & PLAN:    1.  Atypical cardiac pain: Complains of posterior neck, left lateral neck, left shoulder, and left arm pain, described as constant ache for over a week.  Worsening with range of movement.  Does not occur during running.  No worsening symptoms of dyspnea on exertion, fatigue, or dizziness.  I have referred him back to orthopedics to evaluate his neck further possibly need steroid injection or other therapies to relieve his pain.  2.  History of premature coronary artery disease in his family: We will see him again in a week at which time we will order an EKG to evaluate for changes, may need to go ahead and order a nuclear medicine stress test for diagnostic prognostic purposes.  3.  Hyperlipidemia: Has been followed by PCP.  Aggressive lipid-lowering with strong family history of CAD.  4.  Hypertension: Continue lisinopril as directed.  COVID-19 Education: The signs and symptoms of COVID-19 were discussed with the patient and how to seek care for testing (follow up with PCP or arrange E-visit).  The importance of social distancing was discussed today.  Time:   Today, I have spent 20 minutes with the patient with telehealth technology discussing the above problems.     Medication Adjustments/Labs and Tests Ordered: Current medicines are reviewed at length with the patient today.  Concerns regarding medicines are outlined above.   Tests Ordered: No orders of the defined types were placed in this  encounter.   Medication Changes: No orders of the defined types were placed in this encounter.   Disposition:  Follow up 1 week  Signed, Phill Myron. West Pugh, ANP, AACC  09/25/2019 1:22 PM    [facility]

## 2019-09-25 NOTE — Patient Instructions (Signed)
Medication Instructions:  Continue current medications  *If you need a refill on your cardiac medications before your next appointment, please call your pharmacy*   Lab Work: None Ordered  Testing/Procedures: None Ordered   Follow-Up: At [facility], you and your health needs are our priority.  As part of our continuing mission to provide you with exceptional heart care, we have created designated Provider Care Teams.  These Care Teams include your primary Cardiologist (physician) and Advanced Practice Providers (APPs -  Physician Assistants and Nurse Practitioners) who all work together to provide you with the care you need, when you need it.  We recommend signing up for the patient portal called "MyChart".  Sign up information is provided on this After Visit Summary.  MyChart is used to connect with patients for Virtual Visits (Telemedicine).  Patients are able to view lab/test results, encounter notes, upcoming appointments, etc.  Non-urgent messages can be sent to your provider as well.   To learn more about what you can do with MyChart, go to [email or web address].    Your next appointment:   Monday March 29th @ 1:15 PM  The format for your next appointment:   In Person  Provider:   Jory Sims, DNP, ANP

## 2019-09-27 DIAGNOSIS — M5412 Radiculopathy, cervical region: Secondary | ICD-10-CM | POA: Diagnosis not present

## 2019-09-27 DIAGNOSIS — M25512 Pain in left shoulder: Secondary | ICD-10-CM | POA: Diagnosis not present

## 2019-09-30 NOTE — Progress Notes (Signed)
Cardiology Office Note   Date:  10/02/2019   ID:  Troy Luna, DOB 1970/08/01, MRN [id number]  PCP:  Lujean Amel, MD  Cardiologist:  Dr. Percival Spanish  No chief complaint on file.    History of Present Illness: Troy Luna is a 49 y.o. male who presents for ongoing assessment and management of Hypertension with strong family history of premature CAD. He underwent a stress in2019, test by Dr.Hochrien which was negative for ischemia. I saw him last via video visit and he was complaining of neck and shoulder pain. I referred him back to orthopedics for their assessment. He is here for possible repeat stress test if symptoms persisted.   He comes today having been orthopedics who have found that he has significant musculoskeletal issues.  He also has had some straightening of the cervical spine without normal cervical curvature, which possibly can be causing spasms in his neck and shoulders.  They prescribed a muscle relaxer and steroids.  He chose not to take the steroids.  He stating that it is feeling some better.  He is to go back and see orthopedics again on follow-up.  They may need to proceed with MRI.  He continues to run 5 to 7 miles a day without complaints of chest pain worsening shortness of breath or severe fatigue.  He normally trains for marathons and is working his way back to more miles in order to complete a marathon.  He states that he feels out of shape as he is not run as far lately.   Past Medical History:  Diagnosis Date  . Benign positional vertigo 07/2017   resolved now, took pt for  . Chronic kidney disease    left kidney function at 20 percent, lov dr Junious Silk 07-29-17 on chart  . Hypertension   . Incisional hernia     Past Surgical History:  Procedure Laterality Date  . APPENDECTOMY  06/2017  . CYSTOSCOPY  1998   LEFT  . INCISIONAL HERNIA REPAIR N/A 09/28/2017   Procedure: OPEN INCISIONAL HERNIA REPAIR WITH MESH;  Surgeon: Kinsinger, Arta Bruce, MD;   Location: [facility];  Service: General;  Laterality: N/A;  . INSERTION OF MESH  09/28/2017   Procedure: INSERTION OF MESH;  Surgeon: Kieth Brightly Arta Bruce, MD;  Location: [facility];  Service: General;;  . KIDNEY SURGERY Left 1998   pyeloplasty, open  . LAPAROSCOPIC APPENDECTOMY N/A 07/04/2017   Procedure: APPENDECTOMY LAPAROSCOPIC;  Surgeon: Kinsinger, Arta Bruce, MD;  Location: [facility];  Service: General;  Laterality: N/A;     Current Outpatient Medications  Medication Sig Dispense Refill  . lisinopril (PRINIVIL,ZESTRIL) 10 MG tablet Take 10 mg by mouth at bedtime.      No current facility-administered medications for this visit.    Allergies:   Hydrocodone-acetaminophen, Norvasc [amlodipine besylate], and Other    Social History:  The patient  reports that he has never smoked. He has never used smokeless tobacco. He reports current alcohol use. He reports that he does not use drugs.   Family History:  The patient's family history includes Diabetes in his father; Heart disease in his brother; Heart disease (age of onset: 71) in his father; Obesity in his brother; Stroke in his mother.    ROS: All other systems are reviewed and negative. Unless otherwise mentioned in H&P    PHYSICAL EXAM: VS:  BP 138/84   Pulse (!) 54   Ht 5\' 9"  (1.753 m)   Wt 187 lb (  84.8 kg)   BMI 27.62 kg/m  , BMI Body mass index is 27.62 kg/m. GEN: Well nourished, well developed, in no acute distress HEENT: normal Neck: no JVD, carotid bruits, or masses Cardiac:RRR; no murmurs, rubs, or gallops,no edema  Respiratory:  Clear to auscultation bilaterally, normal work of breathing GI: soft, nontender, nondistended, + BS MS: no deformity or atrophy Skin: warm and dry, no rash Neuro:  Strength and sensation are intact Psych: euthymic mood, full affect   EKG: Personally reviewed: Sinus bradycardia heart rate of 54 bpm.  Otherwise normal. Recent Labs: No results found for  requested labs within last 8760 hours.    Lipid Panel No results found for: CHOL, TRIG, HDL, CHOLHDL, VLDL, LDLCALC, LDLDIRECT    Wt Readings from Last 3 Encounters:  10/02/19 187 lb (84.8 kg)  09/25/19 185 lb (83.9 kg)  08/16/18 194 lb (88 kg)      Other studies Reviewed: POET 06/01/2018  Negative POET (Plain Old Exercise Treadmill) with excellent exercise tolerance. No change in therapy.    ASSESSMENT AND PLAN:  1.  Neck and shoulder pain: Likely not cardiac in etiology.  He is being followed by orthopedics with reproducible pain on palpation and plans for further testing if symptoms persist.  He has refused steroids at this time may take some anti-inflammatories if the symptoms here today where he see the exit sign and go to the right and then there is another exercise and go to the left if you see anybody they will show you to you on your way you are welcome to bother him.  It is unlikely that his neck and shoulder pain are being caused by CAD.  2.  Family history of premature coronary artery disease: He still does have concerns about his own risk for CAD.  He did have a negative POET in 2019.  I have discussed with him the possibility of completing a coronary CTA for definitive evaluation for coronary calcium burden.  At this time he does wish to think about it and not pursue any further cardiac testing unless his symptoms are more persistent or he is having new symptoms.  3.Hypertension: Currently continues on lisinopril 10 mg daily.  Blood pressures well controlled.  No changes at this time.   Current medicines are reviewed at length with the patient today.  I have spent 25 minutes dedicated to the care of this patient on the date of this encounter to include pre-visit review of records, assessment, management and diagnostic testing,with shared decision making.  Labs/ tests ordered today include: None   Phill Myron. West Pugh, ANP, AACC   10/02/2019 2:05 PM    [facility] [address] Office [phone] Fax [phone]  Notice: This dictation was prepared with Dragon dictation along with smaller phrase technology. Any transcriptional errors that result from this process are unintentional and may not be corrected upon review.

## 2019-10-02 ENCOUNTER — Other Ambulatory Visit: Payer: Self-pay

## 2019-10-02 ENCOUNTER — Ambulatory Visit (INDEPENDENT_AMBULATORY_CARE_PROVIDER_SITE_OTHER): Payer: BC Managed Care – PPO | Admitting: Adult Health

## 2019-10-02 ENCOUNTER — Encounter: Payer: Self-pay | Admitting: Adult Health

## 2019-10-02 VITALS — BP 138/84 | HR 54 | Ht 69.0 in | Wt 187.0 lb

## 2019-10-02 DIAGNOSIS — R0789 Other chest pain: Secondary | ICD-10-CM

## 2019-10-02 DIAGNOSIS — I10 Essential (primary) hypertension: Secondary | ICD-10-CM | POA: Diagnosis not present

## 2019-10-02 DIAGNOSIS — Z8249 Family history of ischemic heart disease and other diseases of the circulatory system: Secondary | ICD-10-CM

## 2019-10-02 NOTE — Patient Instructions (Signed)
Medication Instructions:  Continue current medications  *If you need a refill on your cardiac medications before your next appointment, please call your pharmacy*   Lab Work: None Ordered   Testing/Procedures: None Ordered   Follow-Up: At [facility], you and your health needs are our priority.  As part of our continuing mission to provide you with exceptional heart care, we have created designated Provider Care Teams.  These Care Teams include your primary Cardiologist (physician) and Advanced Practice Providers (APPs -  Physician Assistants and Nurse Practitioners) who all work together to provide you with the care you need, when you need it.  We recommend signing up for the patient portal called "MyChart".  Sign up information is provided on this After Visit Summary.  MyChart is used to connect with patients for Virtual Visits (Telemedicine).  Patients are able to view lab/test results, encounter notes, upcoming appointments, etc.  Non-urgent messages can be sent to your provider as well.   To learn more about what you can do with MyChart, go to [email or web address].    Your next appointment:   6 month(s)  The format for your next appointment:   In Person  Provider:   You may see Minus Breeding, MD or one of the following Advanced Practice Providers on your designated Care Team:    Rosaria Ferries, PA-C  Jory Sims, DNP, ANP  Cadence Kathlen Mody, NP    Other Instructions  CT Angiogram  A CT angiogram is a procedure to look at the blood vessels in various areas of the body. For this procedure, a large X-ray machine, called a CT scanner, takes detailed pictures of blood vessels that have been injected with a dye (contrast material). A CT angiogram allows your health care provider to see how well blood is flowing to the area of your body that is being checked. Your health care provider will be able to see if there are any problems, such as a blockage. Tell a health  care provider about:  Any allergies you have.  All medicines you are taking, including vitamins, herbs, eye drops, creams, and over-the-counter medicines.  Any problems you or family members have had with anesthetic medicines.  Any blood disorders you have.  Any surgeries you have had.  Any medical conditions you have.  Whether you are pregnant or may be pregnant.  Whether you are breastfeeding.  Any anxiety disorders, chronic pain, or other conditions you have that may increase your stress or prevent you from lying still. What are the risks? Generally, this is a safe procedure. However, problems may occur, including:  Infection.  Bleeding.  Allergic reactions to medicines or dyes.  Damage to other structures or organs.  Kidney damage from the dye or contrast that is used.  Increased risk of cancer from radiation exposure. This risk is low. Talk with your health care provider about: ? The risks and benefits of testing. ? How you can receive the lowest dose of radiation. What happens before the procedure?  Wear comfortable clothing and remove any jewelry.  Follow instructions from your health care provider about eating and drinking. For most people, instructions may include these actions: ? For 12 hours before the test, avoid caffeine. This includes tea, coffee, soda, and energy drinks or pills. ? For 3-4 hours before the test, stop eating or drinking anything but water. ? Stay well hydrated by continuing to drink water before the exam. This will help to clear the contrast dye from your body after the test.  Ask your health care provider about changing or stopping your regular medicines. This is especially important if you are taking diabetes medicines or blood thinners. What happens during the procedure?  An IV tube will be inserted into one of your veins.  You will be asked to lie on an exam table. This table will slide in and out of the CT machine during the  procedure.  Contrast dye will be injected into the IV tube. You might feel warm, or you may get a metallic taste in your mouth.  The table that you are lying on will move into the CT machine tunnel for the scan.  The person running the machine will give you instructions while the scans are being done. You may be asked to: ? Keep your arms above your head. ? Hold your breath. ? Stay very still, even if the table is moving.  When the scanning is complete, you will be moved out of the machine.  The IV tube will be removed. The procedure may vary among health care providers and hospitals. What happens after the procedure?  You might feel warm, or you may get a metallic taste in your mouth.  You may be asked to drink water or other fluids to wash (flush) the contrast material out of your body.  It is up to you to get the results of your procedure. Ask your health care provider, or the department that is doing the procedure, when your results will be ready. Summary  A CT angiogram is a procedure to look at the blood vessels in various areas of the body.  You will need to stay very still during the exam.  You may be asked to drink water or other fluids to wash (flush) the contrast material out of your body after your scan. This information is not intended to replace advice given to you by your health care provider. Make sure you discuss any questions you have with your health care provider. Document Revised: 09/01/2018 Document Reviewed: 02/20/2016 Elsevier Patient Education  [place].

## 2019-10-08 IMAGING — CT CT HEART SCORING
2 series · 16 of 20 positions shown, 18 images · non-contrast
Comparison: 10/22/2017

Addendum:
EXAM:
OVER-READ INTERPRETATION  CT CHEST

The following report is an over-read performed by radiologist Dr.
Shigenori Charlemagne [REDACTED] on 05/18/2018. This
over-read does not include interpretation of cardiac or coronary
anatomy or pathology. The coronary calcium score interpretation by
the cardiologist is attached.
CLINICAL DATA: Risk stratification
Coronary Calcium Score
TECHNIQUE: The patient was scanned on a Siemens Somatom 64 slice scanner. Axial
non-contrast 3 mm slices were carried out through the heart. The
data set was analyzed on a dedicated work station and scored using
the Agatson method.
TECHNIQUE: The patient was scanned on a Siemens Force scanner. Axial

[Series 2: casc 3.0 i36f 2 bestdiast 72 % · axial · 0.40mm/px · z∈[-258,-144]mm · 8 of 50 slices shown, 10 images]
[im 6/50  vessel]
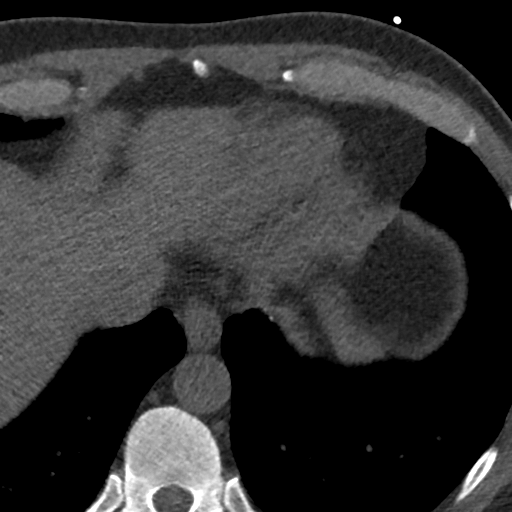
[im 6/50  lung]
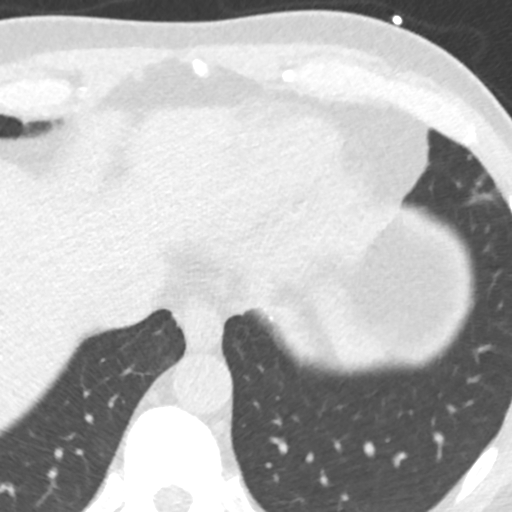
[im 11/50  vessel]
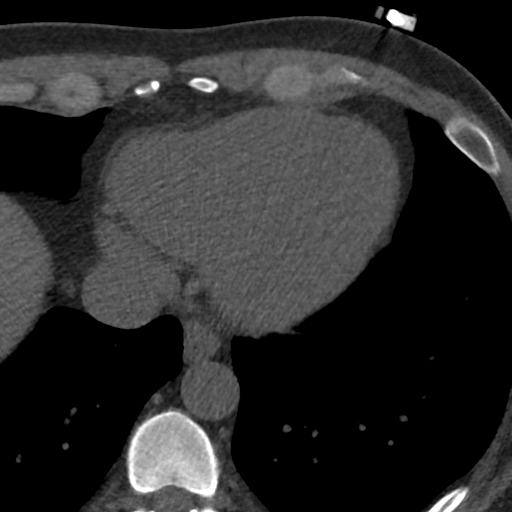
[im 17/50  vessel]
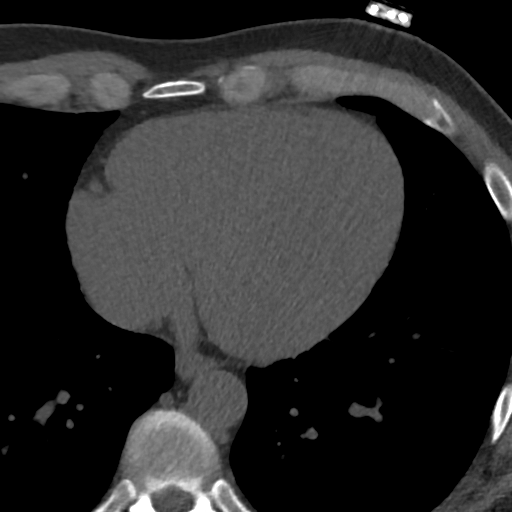
[im 22/50  vessel]
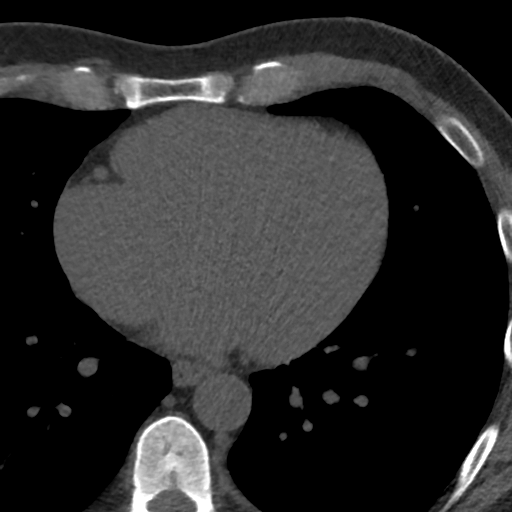
[im 28/50  vessel]
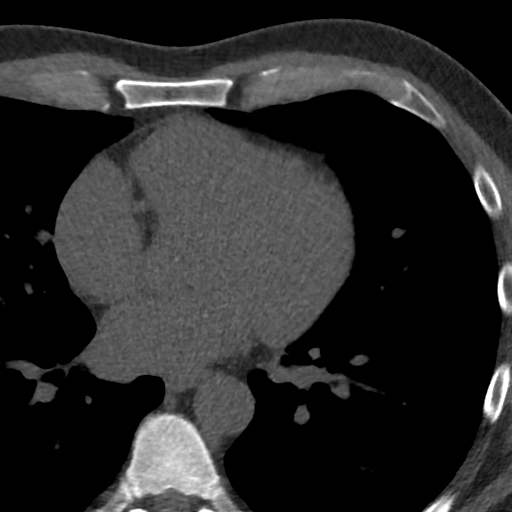
[im 28/50  lung]
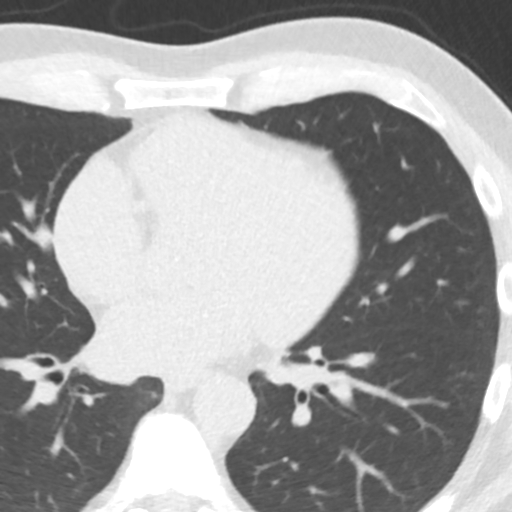
[im 33/50  vessel]
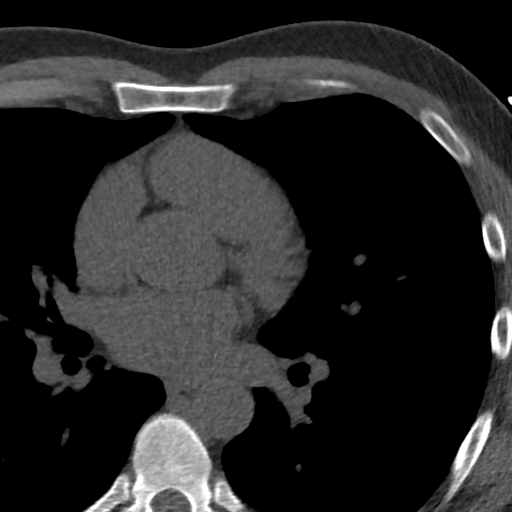
[im 39/50  vessel]
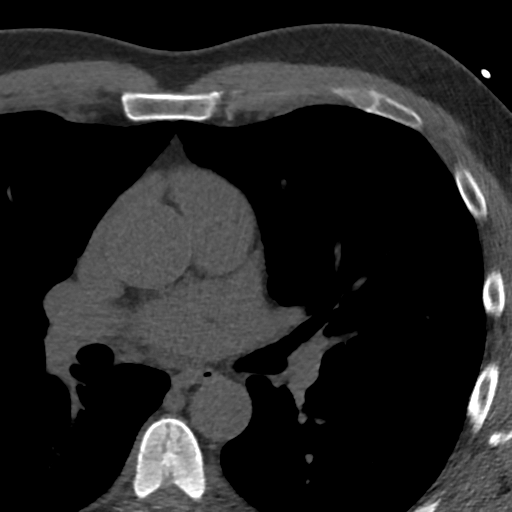
[im 44/50  vessel]
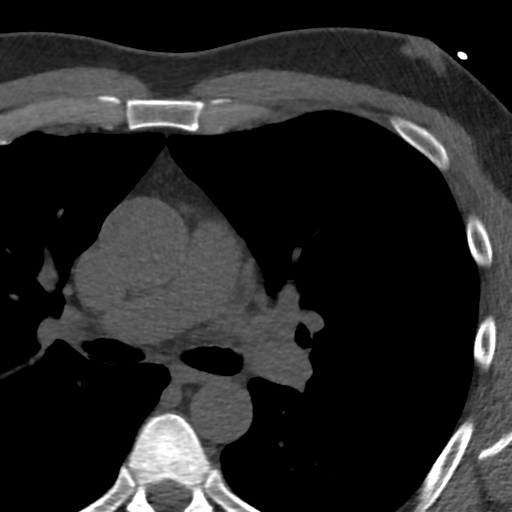

[Series 4: lung st 71 % · axial · 0.70mm/px · z∈[-258,-144]mm · 8 of 50 slices shown]
[im 6/50  lung]
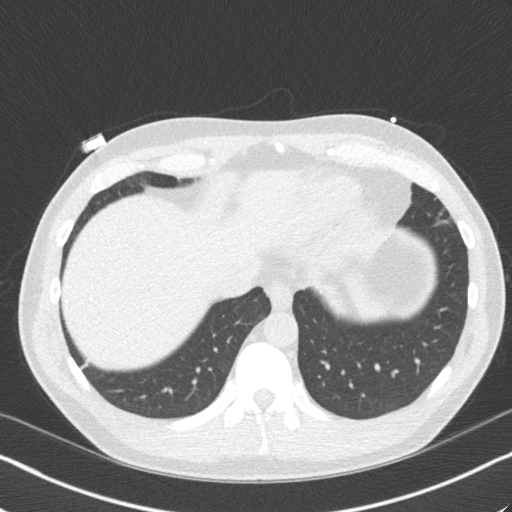
[im 11/50  lung]
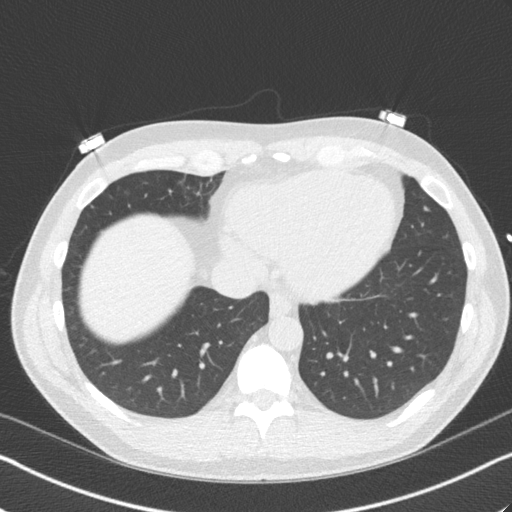
[im 17/50  lung]
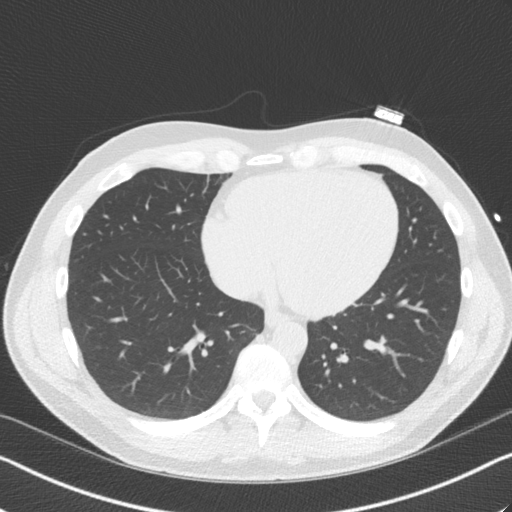
[im 22/50  lung]
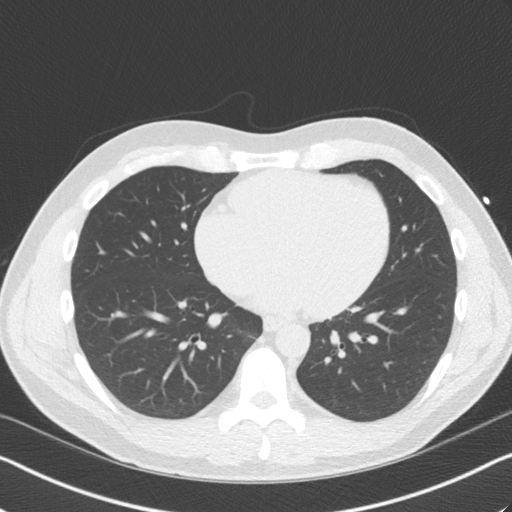
[im 28/50  lung]
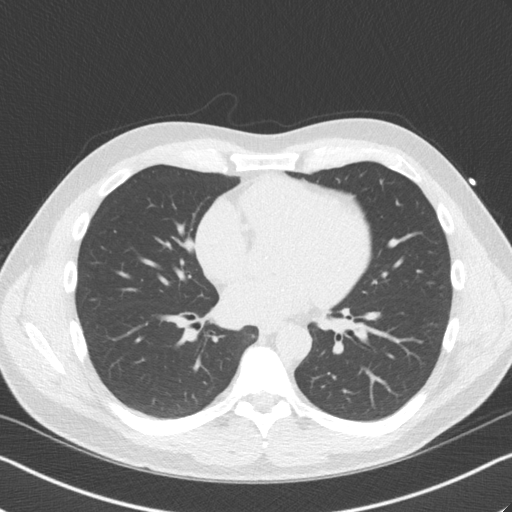
[im 33/50  lung]
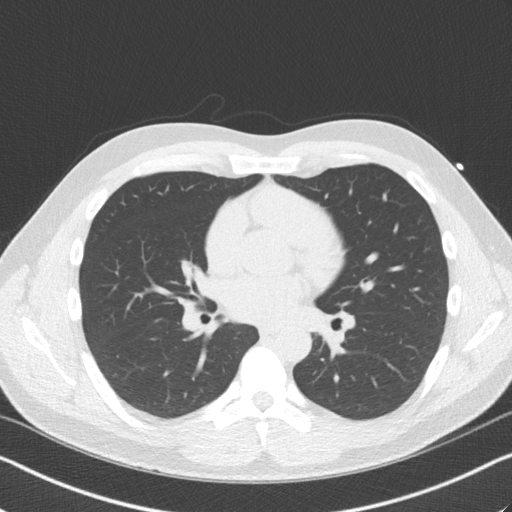
[im 39/50  lung]
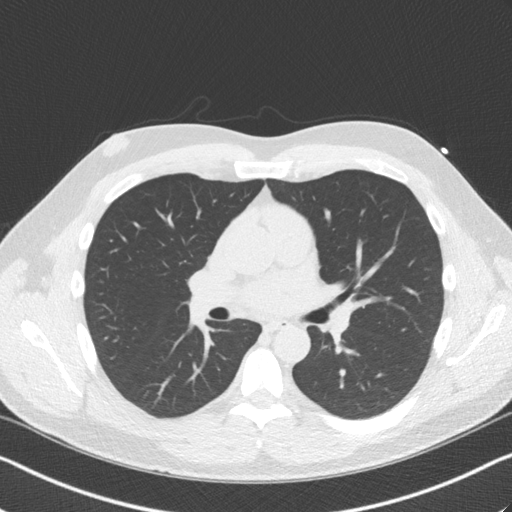
[im 44/50  lung]
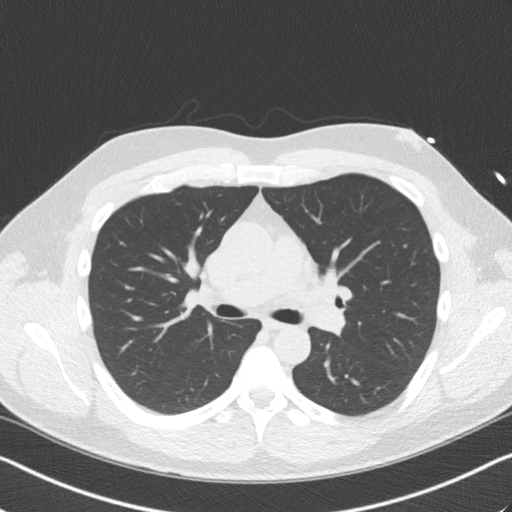

[16 of 20 positions shown; findings below may reference images not displayed]

FINDINGS: Vascular: Heart is normal size.  Visualized aorta is normal caliber.

Mediastinum/Nodes: No adenopathy in the lower mediastinum or hila.

Lungs/Pleura: No confluent opacities or effusions. 3 mm subpleural
anterior right upper lobe nodule on image 11. 4 mm peripheral nodule
in the right middle lobe. These are stable since prior study.

Upper Abdomen: Imaging into the upper abdomen shows no acute
findings.

Musculoskeletal: Mild bilateral gynecomastia. No acute bony
abnormality.
IMPRESSION: Small right lung nodules, 3-4 mm. No follow-up needed if patient is
low-risk. Non-contrast chest CT can be considered in 12 months if
patient is high-risk. This recommendation follows the consensus
statement: Guidelines for Management of Incidental Pulmonary Nodules
Detected on CT Images: From the [HOSPITAL] 7387; Radiology
7387; [DATE].
FINDINGS: Non-cardiac: See separate report from [REDACTED].

Ascending aorta: Normal diameter 3.6 cm

Pericardium: Normal

Coronary arteries: One isolated punctate area of calcium in proximal
RCA and mid LAD
IMPRESSION: Coronary calcium score of 12. This was 75 th percentile for age and
sex matched control.

[REDACTED]AL DATA:  Risk stratification

EXAM:
Coronary Calcium Score
FINDINGS: Non-cardiac: See separate report from [REDACTED].

Ascending Aorta: Normal size, no calcifications.

Pericardium: Normal.

Coronary arteries: Normal origin.
IMPRESSION: Coronary calcium score of 12. This was 75 percentile for age and sex
matched control.

*** End of Addendum ***

## 2019-11-28 DIAGNOSIS — L821 Other seborrheic keratosis: Secondary | ICD-10-CM | POA: Diagnosis not present

## 2020-01-03 DIAGNOSIS — W540XXA Bitten by dog, initial encounter: Secondary | ICD-10-CM | POA: Diagnosis not present

## 2020-01-03 DIAGNOSIS — Z792 Long term (current) use of antibiotics: Secondary | ICD-10-CM | POA: Diagnosis not present

## 2020-01-03 DIAGNOSIS — S99912A Unspecified injury of left ankle, initial encounter: Secondary | ICD-10-CM | POA: Diagnosis not present

## 2020-01-03 DIAGNOSIS — S71132A Puncture wound without foreign body, left thigh, initial encounter: Secondary | ICD-10-CM | POA: Diagnosis not present

## 2020-01-19 DIAGNOSIS — R079 Chest pain, unspecified: Secondary | ICD-10-CM | POA: Diagnosis not present

## 2020-01-22 DIAGNOSIS — Z20822 Contact with and (suspected) exposure to covid-19: Secondary | ICD-10-CM | POA: Diagnosis not present

## 2020-02-12 DIAGNOSIS — Z20822 Contact with and (suspected) exposure to covid-19: Secondary | ICD-10-CM | POA: Diagnosis not present

## 2020-02-26 DIAGNOSIS — M25572 Pain in left ankle and joints of left foot: Secondary | ICD-10-CM | POA: Diagnosis not present

## 2020-03-05 ENCOUNTER — Telehealth: Payer: Self-pay | Admitting: *Deleted

## 2020-03-05 NOTE — Telephone Encounter (Signed)
A message was left.re: his follow up visit. 

## 2020-04-11 DIAGNOSIS — Z7189 Other specified counseling: Secondary | ICD-10-CM | POA: Insufficient documentation

## 2020-04-11 DIAGNOSIS — I10 Essential (primary) hypertension: Secondary | ICD-10-CM | POA: Insufficient documentation

## 2020-04-11 DIAGNOSIS — M542 Cervicalgia: Secondary | ICD-10-CM | POA: Insufficient documentation

## 2020-04-11 DIAGNOSIS — Z8249 Family history of ischemic heart disease and other diseases of the circulatory system: Secondary | ICD-10-CM | POA: Insufficient documentation

## 2020-04-11 NOTE — Progress Notes (Signed)
Cardiology Office Note   Date:  04/12/2020   ID:  [name], DOB May 12, 1971, MRN [id number]  PCP:  Lujean Amel, MD  Cardiologist:   Minus Breeding, MD   Chief Complaint  Patient presents with   Coronary Calcium      History of Present Illness: Troy Luna is a 49 y.o. male who presents for follow up of HTN and family history of premature CAD.  Since I last saw him he has done well.  The patient denies any new symptoms such as chest discomfort, neck or arm discomfort. There has been no new shortness of breath, PND or orthopnea. There have been no reported palpitations, presyncope or syncope.   He does exercise.  He is going to train for a marathon.  He does have some orthopedics.   Past Medical History:  Diagnosis Date   Benign positional vertigo 07/2017   resolved now, took pt for   Chronic kidney disease    left kidney function at 14 percent, lov dr eskridge 07-29-17 on chart   Hypertension    Incisional hernia     Past Surgical History:  Procedure Laterality Date   APPENDECTOMY  06/2017   [place] N/A 09/28/2017   Procedure: OPEN INCISIONAL HERNIA REPAIR WITH MESH;  Surgeon: Kinsinger, Arta Bruce, MD;  Location: [place];  Service: General;  Laterality: N/A;   INSERTION OF MESH  09/28/2017   Procedure: INSERTION OF MESH;  Surgeon: Kieth Brightly Arta Bruce, MD;  Location: [place];  Service: General;;   KIDNEY SURGERY Left 1998   pyeloplasty, open   LAPAROSCOPIC APPENDECTOMY N/A 07/04/2017   Procedure: APPENDECTOMY LAPAROSCOPIC;  Surgeon: Mickeal Skinner, MD;  Location: [place];  Service: General;  Laterality: N/A;     Current Outpatient Medications  Medication Sig Dispense Refill   lisinopril (PRINIVIL,ZESTRIL) 10 MG tablet Take 10 mg by mouth at bedtime.      No current facility-administered medications for this visit.    Allergies:    Hydrocodone-acetaminophen, Norvasc [amlodipine besylate], and Other   ROS:  Please see the history of present illness.   Otherwise, review of systems are positive for none.   All other systems are reviewed and negative.    PHYSICAL EXAM: VS:  BP 115/78    Pulse 69    Temp (!) 95 F (35 C)    Ht 5\' 9"  (1.753 m)    Wt 192 lb 9.6 oz (87.4 kg)    SpO2 98%    BMI 28.44 kg/m  , BMI Body mass index is 28.44 kg/m. GENERAL:  Well appearing NECK:  No jugular venous distention, waveform within normal limits, carotid upstroke brisk and symmetric, no bruits, no thyromegaly LUNGS:  Clear to auscultation bilaterally CHEST:  Unremarkable HEART:  PMI not displaced or sustained,S1 and S2 within normal limits, no S3, no S4, no clicks, no rubs, no murmurs ABD:  Flat, positive bowel sounds normal in frequency in pitch, no bruits, no rebound, no guarding, no midline pulsatile mass, no hepatomegaly, no splenomegaly EXT:  2 plus pulses throughout, no edema, no cyanosis no clubbing   EKG:  EKG is not ordered today.    Recent Labs: No results found for requested labs within last 8760 hours.    Lipid Panel No results found for: CHOL, TRIG, HDL, CHOLHDL, VLDL, LDLCALC, LDLDIRECT    Wt Readings from Last 3 Encounters:  04/12/20 192 lb 9.6  oz (87.4 kg)  10/02/19 187 lb (84.8 kg)  09/25/19 185 lb (83.9 kg)      Other studies Reviewed: Additional studies/ records that were reviewed today include: Labs. Review of the above records demonstrates:  Please see elsewhere in the note.     ASSESSMENT AND PLAN:  ELEVATED CORONARY CALCIUM: He had mildly elevated coronary calcium but he has no symptoms.  He had a negative stress test in 2019.  No further testing is indicated.  He is MESA score was 5.9.  No indication for further therapy.  DYSLIPIDEMIA: LDL was 127 with an HDL of 55 but again we will manage this with diet and exercise.  HTN: His blood pressure is well controlled.  No change in  therapy.    Current medicines are reviewed at length with the patient today.  The patient does not have concerns regarding medicines.  The following changes have been made:  no change  Labs/ tests ordered today include: None No orders of the defined types were placed in this encounter.    Disposition:   FU with me in one year.     Signed, Minus Breeding, MD  04/12/2020 8:04 AM    [facility]

## 2020-04-12 ENCOUNTER — Encounter: Payer: Self-pay | Admitting: Cardiology

## 2020-04-12 ENCOUNTER — Other Ambulatory Visit: Payer: Self-pay

## 2020-04-12 ENCOUNTER — Ambulatory Visit (INDEPENDENT_AMBULATORY_CARE_PROVIDER_SITE_OTHER): Payer: BC Managed Care – PPO | Admitting: Cardiology

## 2020-04-12 VITALS — BP 115/78 | HR 69 | Temp 95.0°F | Ht 69.0 in | Wt 192.6 lb

## 2020-04-12 DIAGNOSIS — Z8249 Family history of ischemic heart disease and other diseases of the circulatory system: Secondary | ICD-10-CM

## 2020-04-12 DIAGNOSIS — M542 Cervicalgia: Secondary | ICD-10-CM | POA: Diagnosis not present

## 2020-04-12 DIAGNOSIS — I10 Essential (primary) hypertension: Secondary | ICD-10-CM | POA: Diagnosis not present

## 2020-04-12 NOTE — Patient Instructions (Signed)
Medication Instructions:  Your physician recommends that you continue on your current medications as directed. Please refer to the Current Medication list given to you today.  *If you need a refill on your cardiac medications before your next appointment, please call your pharmacy*   Follow-Up: At [facility], you and your health needs are our priority.  As part of our continuing mission to provide you with exceptional heart care, we have created designated Provider Care Teams.  These Care Teams include your primary Cardiologist (physician) and Advanced Practice Providers (APPs -  Physician Assistants and Nurse Practitioners) who all work together to provide you with the care you need, when you need it.  We recommend signing up for the patient portal called "MyChart".  Sign up information is provided on this After Visit Summary.  MyChart is used to connect with patients for Virtual Visits (Telemedicine).  Patients are able to view lab/test results, encounter notes, upcoming appointments, etc.  Non-urgent messages can be sent to your provider as well.   To learn more about what you can do with MyChart, go to [email or web address].    Your next appointment:   12 month(s)  The format for your next appointment:   In Person  Provider:   You may see Dr. Percival Spanish or one of the following Advanced Practice Providers on your designated Care Team:    Rosaria Ferries, PA-C  Jory Sims, DNP, ANP    Other Instructions

## 2020-06-06 DIAGNOSIS — I10 Essential (primary) hypertension: Secondary | ICD-10-CM | POA: Diagnosis not present

## 2020-06-06 DIAGNOSIS — Z125 Encounter for screening for malignant neoplasm of prostate: Secondary | ICD-10-CM | POA: Diagnosis not present

## 2020-06-06 DIAGNOSIS — E7800 Pure hypercholesterolemia, unspecified: Secondary | ICD-10-CM | POA: Diagnosis not present

## 2020-06-06 DIAGNOSIS — Z0000 Encounter for general adult medical examination without abnormal findings: Secondary | ICD-10-CM | POA: Diagnosis not present

## 2020-12-19 ENCOUNTER — Emergency Department (HOSPITAL_COMMUNITY)
Admission: EM | Admit: 2020-12-19 | Discharge: 2020-12-19 | Disposition: A | Discharge status: Home / Self Care | Payer: BC Managed Care – PPO | Attending: Emergency Medicine | Admitting: Emergency Medicine

## 2020-12-19 ENCOUNTER — Other Ambulatory Visit: Payer: Self-pay

## 2020-12-19 ENCOUNTER — Emergency Department (HOSPITAL_COMMUNITY): Payer: BC Managed Care – PPO

## 2020-12-19 ENCOUNTER — Encounter (HOSPITAL_COMMUNITY): Payer: Self-pay

## 2020-12-19 DIAGNOSIS — R079 Chest pain, unspecified: Secondary | ICD-10-CM | POA: Diagnosis not present

## 2020-12-19 DIAGNOSIS — R200 Anesthesia of skin: Secondary | ICD-10-CM | POA: Insufficient documentation

## 2020-12-19 DIAGNOSIS — R110 Nausea: Secondary | ICD-10-CM | POA: Diagnosis not present

## 2020-12-19 DIAGNOSIS — Z79899 Other long term (current) drug therapy: Secondary | ICD-10-CM | POA: Diagnosis not present

## 2020-12-19 DIAGNOSIS — N189 Chronic kidney disease, unspecified: Secondary | ICD-10-CM | POA: Insufficient documentation

## 2020-12-19 DIAGNOSIS — R0789 Other chest pain: Secondary | ICD-10-CM | POA: Diagnosis not present

## 2020-12-19 DIAGNOSIS — I129 Hypertensive chronic kidney disease with stage 1 through stage 4 chronic kidney disease, or unspecified chronic kidney disease: Secondary | ICD-10-CM | POA: Diagnosis not present

## 2020-12-19 LAB — COMPREHENSIVE METABOLIC PANEL
ALT: 15 U/L (ref 0–44)
AST: 27 U/L (ref 15–41)
Albumin: 4.4 g/dL (ref 3.5–5.0)
Alkaline Phosphatase: 44 U/L (ref 38–126)
Anion gap: 11 (ref 5–15)
BUN: 20 mg/dL (ref 6–20)
CO2: 21 mmol/L — ABNORMAL LOW (ref 22–32)
Calcium: 9.3 mg/dL (ref 8.9–10.3)
Chloride: 104 mmol/L (ref 98–111)
Creatinine, Ser: 1.22 mg/dL (ref 0.61–1.24)
GFR, Estimated: >60 mL/min (ref >60)
Glucose, Bld: 106 mg/dL — ABNORMAL HIGH (ref 70–99)
Potassium: 3.4 mmol/L — ABNORMAL LOW (ref 3.5–5.1)
Sodium: 136 mmol/L (ref 135–145)
Total Bilirubin: 0.6 mg/dL (ref 0.3–1.2)
Total Protein: 6.8 g/dL (ref 6.5–8.1)

## 2020-12-19 LAB — CBC
HCT: 41.6 % (ref 39.0–52.0)
Hemoglobin: 14.4 g/dL (ref 13.0–17.0)
MCH: 31.3 pg (ref 26.0–34.0)
MCHC: 34.6 g/dL (ref 30.0–36.0)
MCV: 90.4 fL (ref 80.0–100.0)
Platelets: 262 10*3/uL (ref 150–400)
RBC: 4.6 MIL/uL (ref 4.22–5.81)
RDW: 12.4 % (ref 11.5–15.5)
WBC: 6.6 10*3/uL (ref 4.0–10.5)
nRBC: 0 % (ref 0.0–0.2)

## 2020-12-19 LAB — TROPONIN I (HIGH SENSITIVITY)
Troponin I (High Sensitivity): 3 ng/L (ref <18)
Troponin I (High Sensitivity): 3 ng/L (ref <18)

## 2020-12-19 LAB — MAGNESIUM: Magnesium: 2 mg/dL (ref 1.7–2.4)

## 2020-12-19 LAB — CBG MONITORING, ED: Glucose-Capillary: 101 mg/dL — ABNORMAL HIGH (ref 70–99)

## 2020-12-19 MED ORDER — NITROGLYCERIN 0.4 MG SL SUBL
0.4000 mg | SUBLINGUAL_TABLET | SUBLINGUAL | Status: DC | PRN
  Filled 2020-12-19: qty 1

## 2020-12-19 MED ORDER — ASPIRIN 81 MG PO CHEW
324.0000 mg | CHEWABLE_TABLET | Freq: Once | ORAL | Status: AC
  Administered 2020-12-19: 324 mg via ORAL
  Filled 2020-12-19: qty 4

## 2020-12-19 NOTE — ED Notes (Signed)
Pt ambulatory to and from the restroom without assistance.
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
## 2020-12-19 NOTE — ED Notes (Signed)
Patient verbalizes understanding of discharge instructions. Opportunity for questioning and answers were provided. Armband removed by staff, pt discharged from ED ambulatory.   
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
## 2020-12-19 NOTE — Discharge Instructions (Addendum)
Please call your cardiologist in the morning and schedule an appointment for follow-up.  You will need to discuss your emergency department visit and the results.

## 2020-12-25 DIAGNOSIS — E785 Hyperlipidemia, unspecified: Secondary | ICD-10-CM | POA: Insufficient documentation
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
## 2020-12-26 ENCOUNTER — Other Ambulatory Visit: Payer: Self-pay

## 2020-12-26 ENCOUNTER — Ambulatory Visit (INDEPENDENT_AMBULATORY_CARE_PROVIDER_SITE_OTHER): Payer: BC Managed Care – PPO | Admitting: Cardiology

## 2020-12-26 ENCOUNTER — Encounter: Payer: Self-pay | Admitting: Cardiology

## 2020-12-26 VITALS — BP 128/80 | HR 54 | Ht 69.0 in | Wt 189.2 lb

## 2020-12-26 DIAGNOSIS — E785 Hyperlipidemia, unspecified: Secondary | ICD-10-CM | POA: Diagnosis not present

## 2020-12-26 DIAGNOSIS — I10 Essential (primary) hypertension: Secondary | ICD-10-CM

## 2020-12-26 DIAGNOSIS — R072 Precordial pain: Secondary | ICD-10-CM | POA: Diagnosis not present

## 2020-12-26 NOTE — Patient Instructions (Signed)
Medication Instructions:  Your physician recommends that you continue on your current medications as directed. Please refer to the Current Medication list given to you today.  *If you need a refill on your cardiac medications before your next appointment, please call your pharmacy*   Lab Work: None ordered.    Testing/Procedures: None ordered.    Follow-Up: At [facility], you and your health needs are our priority.  As part of our continuing mission to provide you with exceptional heart care, we have created designated Provider Care Teams.  These Care Teams include your primary Cardiologist (physician) and Advanced Practice Providers (APPs -  Physician Assistants and Nurse Practitioners) who all work together to provide you with the care you need, when you need it.  We recommend signing up for the patient portal called "MyChart".  Sign up information is provided on this After Visit Summary.  MyChart is used to connect with patients for Virtual Visits (Telemedicine).  Patients are able to view lab/test results, encounter notes, upcoming appointments, etc.  Non-urgent messages can be sent to your provider as well.   To learn more about what you can do with MyChart, go to [email or web address].    Your next appointment:   Please follow up as planned in October.

## 2020-12-30 DIAGNOSIS — R079 Chest pain, unspecified: Secondary | ICD-10-CM | POA: Diagnosis not present

## 2020-12-30 DIAGNOSIS — M2550 Pain in unspecified joint: Secondary | ICD-10-CM | POA: Diagnosis not present

## 2021-04-06 DIAGNOSIS — R079 Chest pain, unspecified: Secondary | ICD-10-CM | POA: Insufficient documentation

## 2021-04-06 DIAGNOSIS — R931 Abnormal findings on diagnostic imaging of heart and coronary circulation: Secondary | ICD-10-CM | POA: Insufficient documentation
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
## 2021-04-07 ENCOUNTER — Ambulatory Visit: Payer: BC Managed Care – PPO | Admitting: Cardiology

## 2021-04-07 DIAGNOSIS — E785 Hyperlipidemia, unspecified: Secondary | ICD-10-CM

## 2021-04-07 DIAGNOSIS — R931 Abnormal findings on diagnostic imaging of heart and coronary circulation: Secondary | ICD-10-CM

## 2021-04-07 DIAGNOSIS — R079 Chest pain, unspecified: Secondary | ICD-10-CM

## 2021-04-07 DIAGNOSIS — I10 Essential (primary) hypertension: Secondary | ICD-10-CM

## 2021-04-16 ENCOUNTER — Other Ambulatory Visit (HOSPITAL_COMMUNITY): Payer: Self-pay

## 2021-04-16 MED ORDER — INFLUENZA VAC SPLIT QUAD 0.5 ML IM SUSY
PREFILLED_SYRINGE | INTRAMUSCULAR | 0 refills | Status: AC
  Filled 2021-04-16: qty 0.5, 1d supply, fill #0

## 2021-05-19 DIAGNOSIS — U071 COVID-19: Secondary | ICD-10-CM | POA: Diagnosis not present

## 2021-05-19 DIAGNOSIS — R0981 Nasal congestion: Secondary | ICD-10-CM | POA: Diagnosis not present

## 2021-05-19 DIAGNOSIS — R051 Acute cough: Secondary | ICD-10-CM | POA: Diagnosis not present

## 2021-06-23 ENCOUNTER — Other Ambulatory Visit (HOSPITAL_BASED_OUTPATIENT_CLINIC_OR_DEPARTMENT_OTHER): Payer: Self-pay

## 2021-06-23 NOTE — Progress Notes (Signed)
Cardiology Office Note   Date:  06/25/2021   ID:  Troy Luna, DOB 1971-01-27, MRN [id number]  PCP:  Troy Bussing, MD  Cardiologist:   Troy Rotunda, MD   Chief Complaint  Patient presents with   Cough       History of Present Illness: Troy Luna is a 50 y.o. male who presents for follow up of HTN and family history of premature CAD.   He was in the ED for chest pain earlier this year.    He has had an elevated.'s coronary calcium score but negative treadmill.   He has had no further chest discomfort.  However, he has had COVID in November and has had a cough and some decreased exercise tolerance since then.  He was training for marathon and running several miles a day but he has not been able to do this.  He just started walking around the track recently.  He is not having chest pressure, neck Luna arm discomfort.  He is not having PND Luna orthopnea.  He said no new palpitations, presyncope Luna syncope.  Past Medical History:  Diagnosis Date   Benign positional vertigo 07/2017   resolved now, took pt for   Chronic kidney disease    left kidney function at 20 percent, lov dr Troy Luna 07-29-17 on chart   Hypertension    Incisional hernia     Past Surgical History:  Procedure Laterality Date   APPENDECTOMY  06/2017   CYSTOSCOPY  1998   LEFT   INCISIONAL HERNIA REPAIR N/A 09/28/2017   Procedure: OPEN INCISIONAL HERNIA REPAIR WITH MESH;  Surgeon: Kinsinger, De Blanch, MD;  Location: [facility];  Service: General;  Laterality: N/A;   INSERTION OF MESH  09/28/2017   Procedure: INSERTION OF MESH;  Surgeon: Troy Hatch De Blanch, MD;  Location: [facility] [place];  Service: General;;   KIDNEY SURGERY Left 1998   pyeloplasty, open   LAPAROSCOPIC APPENDECTOMY N/A 07/04/2017   Procedure: APPENDECTOMY LAPAROSCOPIC;  Surgeon: Troy Pickle, MD;  Location: [facility];  Service: General;  Laterality: N/A;     Current Outpatient Medications   Medication Sig Dispense Refill   influenza vac split quadrivalent PF (FLUARIX) 0.5 ML injection Inject into the muscle. 0.5 mL 0   lisinopril (PRINIVIL,ZESTRIL) 10 MG tablet Take 10 mg by mouth at bedtime.      sildenafil (VIAGRA) 100 MG tablet 1/2 tablet as needed     No current facility-administered medications for this visit.    Allergies:   Hydrocodone-acetaminophen, Norvasc [amlodipine besylate], and Other   ROS:  Please see the history of present illness.   Otherwise, review of systems are positive for none.   All other systems are reviewed and negative.    PHYSICAL EXAM: VS:  BP 116/76 (BP Location: Left Arm, Patient Position: Sitting, Cuff Size: Normal)    Pulse 69    Resp 20    Ht 5\' 9"  (1.753 m)    Wt 191 lb (86.6 kg)    SpO2 98%    BMI 28.21 kg/m  , BMI Body mass index is 28.21 kg/m. GENERAL:  Well appearing NECK:  No jugular venous distention, waveform within normal limits, carotid upstroke brisk and symmetric, no bruits, no thyromegaly LUNGS:  Clear to auscultation bilaterally CHEST:  Unremarkable HEART:  PMI not displaced Luna sustained,S1 and S2 within normal limits, no S3, no S4, no clicks, no rubs, no murmurs ABD:  Flat, positive bowel sounds normal in  frequency in pitch, no bruits, no rebound, no guarding, no midline pulsatile mass, no hepatomegaly, no splenomegaly EXT:  2 plus pulses throughout, no edema, no cyanosis no clubbing  EKG:  EKG is ordered today. 12/19/2020 sinus rhythm, rate 55, axis within normal limits, intervals within normal limits, no acute ST-T wave changes.   Recent Labs: 12/19/2020: ALT 15; BUN 20; Creatinine, Ser 1.22; Hemoglobin 14.4; Magnesium 2.0; Platelets 262; Potassium 3.4; Sodium 136    Lipid Panel No results found for: CHOL, TRIG, HDL, CHOLHDL, VLDL, LDLCALC, LDLDIRECT    Wt Readings from Last 3 Encounters:  06/25/21 191 lb (86.6 kg)  12/26/20 189 lb 3.2 oz (85.8 kg)  12/19/20 190 lb (86.2 kg)      Other studies  Reviewed: Additional studies/ records that were reviewed today include:  None Review of the above records demonstrates:  NA   ASSESSMENT AND PLAN:  CHEST PAIN: He had no further chest discomfort.  He has had some decreased exercise tolerance.  This might be related to his COVID.  I will check a CRP and an ESR.  If he does not have improvement in his symptoms over the next month Luna so with increased walking I will have a low threshold to do further imaging.   ELEVATED CORONARY CALCIUM:   I am going to check an LP(a).  His MESA score was only 5.6 but I might put him on aspirin.  He has a very elevated LP(a) given his family history.   DYSLIPIDEMIA: LDL was 146 previously but he has not wanted to take a statin.  We had a long conversation about a plant-based Mediterranean diet.   HTN: His blood pressure is controlled.  No change in therapy.     Current medicines are reviewed at length with the patient today.  The patient does not have concerns regarding medicines.  The following changes have been made:  None  Labs/ tests ordered today include:   Orders Placed This Encounter  Procedures   Sed Rate (ESR)   C-reactive protein   Lipoprotein A (LPA)   EKG 12-Lead      Disposition:   FU with me in 1 year Luna sooner if needed   Signed, Troy Breeding, MD  06/25/2021 9:41 AM    [place]

## 2021-06-25 ENCOUNTER — Encounter: Payer: Self-pay | Admitting: Cardiology

## 2021-06-25 ENCOUNTER — Ambulatory Visit (INDEPENDENT_AMBULATORY_CARE_PROVIDER_SITE_OTHER): Payer: BC Managed Care – PPO | Admitting: Cardiology

## 2021-06-25 ENCOUNTER — Other Ambulatory Visit: Payer: Self-pay

## 2021-06-25 VITALS — BP 116/76 | HR 69 | Resp 20 | Ht 69.0 in | Wt 191.0 lb

## 2021-06-25 DIAGNOSIS — R072 Precordial pain: Secondary | ICD-10-CM | POA: Diagnosis not present

## 2021-06-25 DIAGNOSIS — I10 Essential (primary) hypertension: Secondary | ICD-10-CM | POA: Diagnosis not present

## 2021-06-25 DIAGNOSIS — E785 Hyperlipidemia, unspecified: Secondary | ICD-10-CM | POA: Diagnosis not present

## 2021-06-25 DIAGNOSIS — R931 Abnormal findings on diagnostic imaging of heart and coronary circulation: Secondary | ICD-10-CM | POA: Diagnosis not present

## 2021-06-25 NOTE — Patient Instructions (Signed)

## 2021-06-25 NOTE — Addendum Note (Signed)
Addended by: Derrick Ravel on: 03/08/148 09:58 AM   Modules accepted: Orders

## 2021-06-26 LAB — SEDIMENTATION RATE: Sed Rate: 2 mm/hr (ref 0–30)

## 2021-06-26 LAB — LIPOPROTEIN A (LPA): Lipoprotein (a): 8.4 nmol/L (ref <75.0)

## 2021-06-26 LAB — C-REACTIVE PROTEIN: CRP: 2 mg/L (ref 0–10)

## 2021-07-02 NOTE — Addendum Note (Signed)
Addended by: Minus Breeding on: 07/02/2021 11:06 AM   Modules accepted: Orders

## 2021-07-04 DIAGNOSIS — R059 Cough, unspecified: Secondary | ICD-10-CM | POA: Diagnosis not present

## 2021-07-04 DIAGNOSIS — J40 Bronchitis, not specified as acute or chronic: Secondary | ICD-10-CM | POA: Diagnosis not present

## 2021-07-04 DIAGNOSIS — Z03818 Encounter for observation for suspected exposure to other biological agents ruled out: Secondary | ICD-10-CM | POA: Diagnosis not present

## 2021-07-10 DIAGNOSIS — F5221 Male erectile disorder: Secondary | ICD-10-CM | POA: Diagnosis not present

## 2021-07-10 DIAGNOSIS — I10 Essential (primary) hypertension: Secondary | ICD-10-CM | POA: Diagnosis not present

## 2021-07-10 DIAGNOSIS — R059 Cough, unspecified: Secondary | ICD-10-CM | POA: Diagnosis not present

## 2021-07-22 DIAGNOSIS — R221 Localized swelling, mass and lump, neck: Secondary | ICD-10-CM | POA: Diagnosis not present

## 2021-07-22 DIAGNOSIS — I10 Essential (primary) hypertension: Secondary | ICD-10-CM | POA: Diagnosis not present

## 2021-07-23 ENCOUNTER — Ambulatory Visit (HOSPITAL_BASED_OUTPATIENT_CLINIC_OR_DEPARTMENT_OTHER)
Admission: RE | Admit: 2021-07-23 | Discharge: 2021-07-23 | Disposition: A | Discharge status: Home / Self Care | Payer: BC Managed Care – PPO | Source: Ambulatory Visit | Attending: Family Medicine | Admitting: Family Medicine

## 2021-07-23 ENCOUNTER — Other Ambulatory Visit (HOSPITAL_BASED_OUTPATIENT_CLINIC_OR_DEPARTMENT_OTHER): Payer: Self-pay | Admitting: Family Medicine

## 2021-07-23 ENCOUNTER — Other Ambulatory Visit: Payer: Self-pay

## 2021-07-23 DIAGNOSIS — M542 Cervicalgia: Secondary | ICD-10-CM | POA: Diagnosis not present

## 2021-07-23 DIAGNOSIS — R221 Localized swelling, mass and lump, neck: Secondary | ICD-10-CM | POA: Diagnosis not present

## 2021-07-24 DIAGNOSIS — L820 Inflamed seborrheic keratosis: Secondary | ICD-10-CM | POA: Diagnosis not present

## 2021-07-30 DIAGNOSIS — R053 Chronic cough: Secondary | ICD-10-CM | POA: Diagnosis not present

## 2021-07-30 DIAGNOSIS — I10 Essential (primary) hypertension: Secondary | ICD-10-CM | POA: Diagnosis not present

## 2021-07-30 DIAGNOSIS — K118 Other diseases of salivary glands: Secondary | ICD-10-CM | POA: Diagnosis not present

## 2021-09-02 DIAGNOSIS — R221 Localized swelling, mass and lump, neck: Secondary | ICD-10-CM | POA: Diagnosis not present

## 2021-09-03 ENCOUNTER — Other Ambulatory Visit: Payer: Self-pay | Admitting: Otolaryngology

## 2021-09-03 DIAGNOSIS — R221 Localized swelling, mass and lump, neck: Secondary | ICD-10-CM

## 2021-09-11 ENCOUNTER — Ambulatory Visit
Admission: RE | Admit: 2021-09-11 | Discharge: 2021-09-11 | Disposition: A | Discharge status: Home / Self Care | Payer: BC Managed Care – PPO | Source: Ambulatory Visit | Attending: Otolaryngology | Admitting: Otolaryngology

## 2021-09-11 DIAGNOSIS — K111 Hypertrophy of salivary gland: Secondary | ICD-10-CM | POA: Diagnosis not present

## 2021-09-11 DIAGNOSIS — M47812 Spondylosis without myelopathy or radiculopathy, cervical region: Secondary | ICD-10-CM | POA: Diagnosis not present

## 2021-09-11 DIAGNOSIS — R221 Localized swelling, mass and lump, neck: Secondary | ICD-10-CM | POA: Diagnosis not present

## 2021-09-11 MED ORDER — IOPAMIDOL (ISOVUE-300) INJECTION 61%
75.0000 mL | Freq: Once | INTRAVENOUS | Status: AC | PRN
  Administered 2021-09-11: 75 mL via INTRAVENOUS

## 2022-02-24 DIAGNOSIS — R051 Acute cough: Secondary | ICD-10-CM | POA: Diagnosis not present

## 2022-02-24 DIAGNOSIS — Z20822 Contact with and (suspected) exposure to covid-19: Secondary | ICD-10-CM | POA: Diagnosis not present

## 2022-03-03 DIAGNOSIS — M79602 Pain in left arm: Secondary | ICD-10-CM | POA: Diagnosis not present

## 2022-03-23 DIAGNOSIS — M67922 Unspecified disorder of synovium and tendon, left upper arm: Secondary | ICD-10-CM | POA: Diagnosis not present

## 2022-03-23 DIAGNOSIS — Z131 Encounter for screening for diabetes mellitus: Secondary | ICD-10-CM | POA: Diagnosis not present

## 2022-03-23 DIAGNOSIS — Z0000 Encounter for general adult medical examination without abnormal findings: Secondary | ICD-10-CM | POA: Diagnosis not present

## 2022-03-23 DIAGNOSIS — I10 Essential (primary) hypertension: Secondary | ICD-10-CM | POA: Diagnosis not present

## 2022-03-23 DIAGNOSIS — E782 Mixed hyperlipidemia: Secondary | ICD-10-CM | POA: Diagnosis not present

## 2022-03-23 DIAGNOSIS — Z125 Encounter for screening for malignant neoplasm of prostate: Secondary | ICD-10-CM | POA: Diagnosis not present

## 2022-03-23 DIAGNOSIS — M25532 Pain in left wrist: Secondary | ICD-10-CM | POA: Diagnosis not present

## 2022-03-23 DIAGNOSIS — R109 Unspecified abdominal pain: Secondary | ICD-10-CM | POA: Diagnosis not present

## 2022-04-02 DIAGNOSIS — M7702 Medial epicondylitis, left elbow: Secondary | ICD-10-CM | POA: Diagnosis not present

## 2022-04-14 ENCOUNTER — Other Ambulatory Visit (HOSPITAL_BASED_OUTPATIENT_CLINIC_OR_DEPARTMENT_OTHER): Payer: Self-pay

## 2022-04-17 ENCOUNTER — Other Ambulatory Visit (HOSPITAL_BASED_OUTPATIENT_CLINIC_OR_DEPARTMENT_OTHER): Payer: Self-pay

## 2022-04-17 MED ORDER — FLUARIX QUADRIVALENT 0.5 ML IM SUSY
PREFILLED_SYRINGE | INTRAMUSCULAR | 0 refills | Status: AC
  Filled 2022-04-17: qty 0.5, 1d supply, fill #0

## 2022-05-15 ENCOUNTER — Other Ambulatory Visit (HOSPITAL_BASED_OUTPATIENT_CLINIC_OR_DEPARTMENT_OTHER): Payer: Self-pay

## 2022-06-13 DIAGNOSIS — J029 Acute pharyngitis, unspecified: Secondary | ICD-10-CM | POA: Diagnosis not present

## 2022-06-13 DIAGNOSIS — J069 Acute upper respiratory infection, unspecified: Secondary | ICD-10-CM | POA: Diagnosis not present

## 2022-06-13 DIAGNOSIS — Z03818 Encounter for observation for suspected exposure to other biological agents ruled out: Secondary | ICD-10-CM | POA: Diagnosis not present

## 2022-06-13 DIAGNOSIS — R051 Acute cough: Secondary | ICD-10-CM | POA: Diagnosis not present

## 2022-06-15 ENCOUNTER — Other Ambulatory Visit (HOSPITAL_BASED_OUTPATIENT_CLINIC_OR_DEPARTMENT_OTHER): Payer: Self-pay

## 2022-06-15 DIAGNOSIS — M7702 Medial epicondylitis, left elbow: Secondary | ICD-10-CM | POA: Diagnosis not present

## 2022-06-18 DIAGNOSIS — R0981 Nasal congestion: Secondary | ICD-10-CM | POA: Diagnosis not present

## 2022-06-18 DIAGNOSIS — J320 Chronic maxillary sinusitis: Secondary | ICD-10-CM | POA: Diagnosis not present

## 2022-06-21 NOTE — Progress Notes (Unsigned)
Cardiology Office Note   Date:  06/22/2022   ID:  Troy Luna, DOB Apr 09, 1971, MRN [id number]  PCP:  Lujean Amel, MD  Cardiologist:   Minus Breeding, MD   Chief Complaint  Patient presents with   Chest Pain       History of Present Illness: Troy Luna is a 51 y.o. male who presents for follow up of HTN and family history of premature CAD.   He was in the ED for chest pain earlier this year.    He has had an elevated coronary calcium score but negative treadmill.  Since I last saw him he has had some chest discomfort.  He had some this weekend while driving in the rain from [place].  He denies any neck or arm discomfort.  He says that the pain or short lived and left sided.  He does not bring this on with physical exercise.  There is no associated nausea vomiting or diaphoresis.  It is dull.  It is mild to moderate.  He has not really had this before.   Past Medical History:  Diagnosis Date   Benign positional vertigo 07/2017   resolved now, took pt for   Chronic kidney disease    left kidney function at 73 percent, lov dr eskridge 07-29-17 on chart   Hypertension    Incisional hernia     Past Surgical History:  Procedure Laterality Date   APPENDECTOMY  06/2017   [place] N/A 09/28/2017   Procedure: OPEN INCISIONAL HERNIA REPAIR WITH MESH;  Surgeon: Kinsinger, Arta Bruce, MD;  [facility];  Service: General;  Laterality: N/A;   INSERTION OF MESH  09/28/2017   Procedure: INSERTION OF MESH;  Surgeon: Kieth Brightly Arta Bruce, MD;  Location: [place];  Service: General;;   KIDNEY SURGERY Left 1998   pyeloplasty, open   LAPAROSCOPIC APPENDECTOMY N/A 07/04/2017   Procedure: APPENDECTOMY LAPAROSCOPIC;  Surgeon: Mickeal Skinner, MD;  Location: [place];  Service: General;  Laterality: N/A;     Current Outpatient Medications  Medication Sig Dispense Refill    amoxicillin-clavulanate (AUGMENTIN) 875-125 MG tablet Take 1 tablet by mouth 2 (two) times daily.     influenza vac split quadrivalent PF (FLUARIX QUADRIVALENT) 0.5 ML injection Inject into the muscle. 0.5 mL 0   influenza vac split quadrivalent PF (FLUARIX) 0.5 ML injection Inject into the muscle. 0.5 mL 0   lisinopril (PRINIVIL,ZESTRIL) 10 MG tablet Take 10 mg by mouth at bedtime.      sildenafil (VIAGRA) 100 MG tablet 1/2 tablet as needed     No current facility-administered medications for this visit.    Allergies:   Hydrocodone-acetaminophen, Norvasc [amlodipine besylate], and Other   ROS:  Please see the history of present illness.   Otherwise, review of systems are positive for none.   All other systems are reviewed and negative.    PHYSICAL EXAM: VS:  BP 121/76   Pulse (!) 51   Ht '5\' 9"'$  (1.753 m)   Wt 177 lb 6.4 oz (80.5 kg)   SpO2 99%   BMI 26.20 kg/m  , BMI Body mass index is 26.2 kg/m. GENERAL:  Well appearing NECK:  No jugular venous distention, waveform within normal limits, carotid upstroke brisk and symmetric, no bruits, no thyromegaly LUNGS:  Clear to auscultation bilaterally CHEST:  Unremarkable HEART:  PMI not displaced or sustained,S1 and S2 within normal limits, no  S3, no S4, no clicks, no rubs, no murmurs ABD:  Flat, positive bowel sounds normal in frequency in pitch, no bruits, no rebound, no guarding, no midline pulsatile mass, no hepatomegaly, no splenomegaly EXT:  2 plus pulses throughout, no edema, no cyanosis no clubbing  EKG:  EKG is  ordered today. 12/19/2020 sinus rhythm, rate 51, axis within normal limits, intervals within normal limits, no acute ST-T wave changes.   Recent Labs: No results found for requested labs within last 365 days.    Lipid Panel No results found for: "CHOL", "TRIG", "HDL", "CHOLHDL", "VLDL", "LDLCALC", "LDLDIRECT"    Wt Readings from Last 3 Encounters:  06/22/22 177 lb 6.4 oz (80.5 kg)  06/25/21 191 lb (86.6 kg)   12/26/20 189 lb 3.2 oz (85.8 kg)      Other studies Reviewed: Additional studies/ records that were reviewed today include:  Labs Review of the above records demonstrates: See elsewhere   ASSESSMENT AND PLAN:  CHEST PAIN:   Given this I would like to screen him with a POET (Plain Old Exercise Treadmill).  I think the pretest probability of obstructive coronary disease is low but he does have risk factors including coronary calcium.   ELEVATED CORONARY CALCIUM:   He had normal LP(a).  He is going to be tested as above.  We will continue with risk reduction.   DYSLIPIDEMIA: LDL was 124 with an HDL of 46.  He said he has changed his diet since then.  He has lost about 10 pounds.  He wants to get this repeated and try to stay off statins.  His Mesa score was not elevated previously.   HTN: His blood pressure is controlled and no change in therapy.      Current medicines are reviewed at length with the patient today.  The patient does not have concerns regarding medicines.  The following changes have been made:  None  Labs/ tests ordered today include:     Orders Placed This Encounter  Procedures   Exercise Tolerance Test   EKG 12-Lead      Disposition:   FU with me in one year.      Signed, Minus Breeding, MD  06/22/2022 12:12 PM    [place]

## 2022-06-22 ENCOUNTER — Ambulatory Visit: Payer: BC Managed Care – PPO | Attending: Cardiology | Admitting: Cardiology

## 2022-06-22 ENCOUNTER — Encounter: Payer: Self-pay | Admitting: Cardiology

## 2022-06-22 VITALS — BP 121/76 | HR 51 | Ht 69.0 in | Wt 177.4 lb

## 2022-06-22 DIAGNOSIS — R072 Precordial pain: Secondary | ICD-10-CM

## 2022-06-22 DIAGNOSIS — R931 Abnormal findings on diagnostic imaging of heart and coronary circulation: Secondary | ICD-10-CM | POA: Diagnosis not present

## 2022-06-22 DIAGNOSIS — I10 Essential (primary) hypertension: Secondary | ICD-10-CM | POA: Diagnosis not present

## 2022-06-22 DIAGNOSIS — E785 Hyperlipidemia, unspecified: Secondary | ICD-10-CM | POA: Diagnosis not present

## 2022-06-22 NOTE — Patient Instructions (Signed)
Medication Instructions:  Your physician recommends that you continue on your current medications as directed. Please refer to the Current Medication list given to you today.  *If you need a refill on your cardiac medications before your next appointment, please call your pharmacy*    Testing/Procedures: Your physician has requested that you have an exercise tolerance test. For further information please visit [email or web address]. Please also follow instruction sheet, as given.    Follow-Up: At [facility], you and your health needs are our priority.  As part of our continuing mission to provide you with exceptional heart care, we have created designated Provider Care Teams.  These Care Teams include your primary Cardiologist (physician) and Advanced Practice Providers (APPs -  Physician Assistants and Nurse Practitioners) who all work together to provide you with the care you need, when you need it.   Your next appointment:   1 year(s)  The format for your next appointment:   In Person  Provider:   Minus Breeding, MD     Important Information About Sugar

## 2022-06-25 ENCOUNTER — Ambulatory Visit: Payer: BC Managed Care – PPO | Attending: Cardiology

## 2022-06-25 DIAGNOSIS — R931 Abnormal findings on diagnostic imaging of heart and coronary circulation: Secondary | ICD-10-CM

## 2022-06-25 DIAGNOSIS — R072 Precordial pain: Secondary | ICD-10-CM

## 2022-06-25 LAB — EXERCISE TOLERANCE TEST
Angina Index: 0
Duke Treadmill Score: 15
Estimated workload: 17.5
Exercise duration (min): 15 min
Exercise duration (sec): 0 s
MPHR: 168 {beats}/min
Peak HR: 148 {beats}/min
Percent HR: 88 %
RPE: 16
Rest HR: 57 {beats}/min
ST Depression (mm): 0 mm

## 2022-08-05 DIAGNOSIS — R1032 Left lower quadrant pain: Secondary | ICD-10-CM | POA: Diagnosis not present

## 2022-08-06 DIAGNOSIS — R1032 Left lower quadrant pain: Secondary | ICD-10-CM | POA: Diagnosis not present

## 2022-09-15 DIAGNOSIS — B9689 Other specified bacterial agents as the cause of diseases classified elsewhere: Secondary | ICD-10-CM | POA: Diagnosis not present

## 2022-09-15 DIAGNOSIS — J019 Acute sinusitis, unspecified: Secondary | ICD-10-CM | POA: Diagnosis not present

## 2023-02-25 ENCOUNTER — Other Ambulatory Visit (HOSPITAL_BASED_OUTPATIENT_CLINIC_OR_DEPARTMENT_OTHER): Payer: Self-pay

## 2023-06-23 NOTE — Progress Notes (Unsigned)
  Cardiology Office Note:   Date:  06/24/2023  ID:  Troy Luna, DOB [birth year], MRN [id number] PCP: Darrow Bussing, MD  [facility] Providers Cardiologist:  Rollene Rotunda, MD {  History of Present Illness:   Troy Luna is a 52 y.o. male who presents for follow up of HTN and family history of premature CAD.   He was in the ED for chest pain earlier this year.    He has had an elevated coronary calcium score but negative treadmill.  He had some chest pain last year but again with non anginal features and had a negative POET (Plain Old Exercise Treadmill).    He was training for marathon and then a half marathon although he got COVID and interrupted that.  He still running quite a bit. The patient denies any new symptoms such as chest discomfort, neck or arm discomfort. There has been no new shortness of breath, PND or orthopnea. There have been no reported palpitations, presyncope or syncope.   ROS: As stated in the HPI and negative for all other systems.  Studies Reviewed:    EKG:   EKG Interpretation Date/Time:  Thursday June 24 2023 12:52:09 EST Ventricular Rate:  49 PR Interval:  162 QRS Duration:  78 QT Interval:  438 QTC Calculation: 395 R Axis:   15  Text Interpretation: Sinus bradycardia When compared with ECG of 19-Dec-2020 17:36, Vent. rate has decreased BY  30 BPM rate is slower Confirmed by Rollene Rotunda ([id number]) on 06/24/2023 1:22:43 PM     Risk Assessment/Calculations:              Physical Exam:   VS:  BP 131/81 (BP Location: Left Arm, Patient Position: Sitting, Cuff Size: Normal)   Pulse (!) 49   Ht 5\' 9"  (1.753 m)   Wt 181 lb (82.1 kg)   SpO2 99%   BMI 26.73 kg/m    Wt Readings from Last 3 Encounters:  06/24/23 181 lb (82.1 kg)  06/22/22 177 lb 6.4 oz (80.5 kg)  06/25/21 191 lb (86.6 kg)     GEN: Well nourished, well developed in no acute distress NECK: No JVD; No carotid bruits CARDIAC: RRR, no murmurs, rubs,  gallops RESPIRATORY:  Clear to auscultation without rales, wheezing or rhonchi  ABDOMEN: Soft, non-tender, non-distended EXTREMITIES:  No edema; No deformity   ASSESSMENT AND PLAN:   CHEST PAIN:   The patient has had no symptoms.  We are continuing with risk reduction.  He had a negative treadmill last year.   ELEVATED CORONARY CALCIUM:   He had a normal LP(a).  We are pursuing aggressive risk reduction.    DYSLIPIDEMIA: LDL was 129 even though he diet and tried to watch his cholesterol.  It was 124 last year.  He might consider taking statin and I will prescribe Crestor 20 mg daily with a plan for lipid profile in 3 months.   Follow up with me in one year.   Signed, Rollene Rotunda, MD

## 2023-06-24 ENCOUNTER — Ambulatory Visit: Payer: PRIVATE HEALTH INSURANCE | Attending: Cardiology | Admitting: Cardiology

## 2023-06-24 ENCOUNTER — Encounter: Payer: Self-pay | Admitting: Cardiology

## 2023-06-24 VITALS — BP 131/81 | HR 49 | Ht 69.0 in | Wt 181.0 lb

## 2023-06-24 DIAGNOSIS — R7989 Other specified abnormal findings of blood chemistry: Secondary | ICD-10-CM

## 2023-06-24 DIAGNOSIS — E785 Hyperlipidemia, unspecified: Secondary | ICD-10-CM

## 2023-06-24 DIAGNOSIS — R072 Precordial pain: Secondary | ICD-10-CM

## 2023-06-24 MED ORDER — ROSUVASTATIN CALCIUM 20 MG PO TABS: 20.0000 mg | ORAL_TABLET | Freq: Every day | ORAL | 3 refills | Status: AC

## 2023-06-24 NOTE — Patient Instructions (Signed)
Medication Instructions:  Resuvastatin 20 mg daily by mouth script has been sent to your pharmacy. If you decide to start this medicine please have the fasting lipid profile in 3 months.  *If you need a refill on your cardiac medications before your next appointment, please call your pharmacy*   Lab Work: Fasting Lipid profile in 3 months if resuvastatin is started.  If you have labs (blood work) drawn today and your tests are completely normal, you will receive your results only by: MyChart Message (if you have MyChart) OR A paper copy in the mail If you have any lab test that is abnormal or we need to change your treatment, we will call you to review the results.  Follow-Up: At [facility], you and your health needs are our priority.  As part of our continuing mission to provide you with exceptional heart care, we have created designated Provider Care Teams.  These Care Teams include your primary Cardiologist (physician) and Advanced Practice Providers (APPs -  Physician Assistants and Nurse Practitioners) who all work together to provide you with the care you need, when you need it.  Your next appointment:   12 month(s)  Provider:   Rollene Rotunda, MD
